# Patient Record
Sex: Female | Born: 1975 | Race: White | Hispanic: No | Smoking: Current every day smoker
Health system: Southern US, Community
[De-identification: ages and names within clinical notes are randomized; demographics above are authoritative.]

## PROBLEM LIST (undated history)

## (undated) DIAGNOSIS — N6310 Unspecified lump in the right breast, unspecified quadrant: Secondary | ICD-10-CM

## (undated) DIAGNOSIS — Z9889 Other specified postprocedural states: Secondary | ICD-10-CM

## (undated) DIAGNOSIS — R112 Nausea with vomiting, unspecified: Secondary | ICD-10-CM

## (undated) DIAGNOSIS — N879 Dysplasia of cervix uteri, unspecified: Secondary | ICD-10-CM

## (undated) DIAGNOSIS — Z87442 Personal history of urinary calculi: Secondary | ICD-10-CM

## (undated) DIAGNOSIS — T8859XA Other complications of anesthesia, initial encounter: Secondary | ICD-10-CM

## (undated) DIAGNOSIS — T4145XA Adverse effect of unspecified anesthetic, initial encounter: Secondary | ICD-10-CM

## (undated) HISTORY — PX: KNEE SURGERY: SHX244

## (undated) HISTORY — DX: Dysplasia of cervix uteri, unspecified: N87.9

## (undated) HISTORY — PX: BREAST BIOPSY: SHX20

## (undated) HISTORY — PX: OTHER SURGICAL HISTORY: SHX169

## (undated) HISTORY — PX: KIDNEY STONE SURGERY: SHX686

## (undated) HISTORY — PX: TUBAL LIGATION: SHX77

---

## 1998-02-27 HISTORY — PX: GYNECOLOGIC CRYOSURGERY: SHX857

## 1998-04-13 ENCOUNTER — Other Ambulatory Visit: Admission: RE | Admit: 1998-04-13 | Discharge: 1998-04-13 | Payer: Self-pay | Admitting: *Deleted

## 1998-12-10 ENCOUNTER — Encounter: Payer: Self-pay | Admitting: Obstetrics and Gynecology

## 1998-12-10 ENCOUNTER — Inpatient Hospital Stay (HOSPITAL_COMMUNITY): Admission: AD | Admit: 1998-12-10 | Discharge: 1998-12-10 | Payer: Self-pay | Admitting: Obstetrics and Gynecology

## 1998-12-13 ENCOUNTER — Encounter: Payer: Self-pay | Admitting: Obstetrics and Gynecology

## 1998-12-13 ENCOUNTER — Ambulatory Visit (HOSPITAL_COMMUNITY): Admission: RE | Admit: 1998-12-13 | Discharge: 1998-12-13 | Payer: Self-pay | Admitting: Obstetrics and Gynecology

## 1999-03-15 ENCOUNTER — Inpatient Hospital Stay (HOSPITAL_COMMUNITY): Admission: AD | Admit: 1999-03-15 | Discharge: 1999-03-15 | Payer: Self-pay | Admitting: Obstetrics and Gynecology

## 1999-04-10 ENCOUNTER — Inpatient Hospital Stay (HOSPITAL_COMMUNITY): Admission: AD | Admit: 1999-04-10 | Discharge: 1999-04-12 | Payer: Self-pay | Admitting: Obstetrics and Gynecology

## 1999-12-15 ENCOUNTER — Encounter: Payer: Self-pay | Admitting: Emergency Medicine

## 1999-12-15 ENCOUNTER — Emergency Department (HOSPITAL_COMMUNITY): Admission: EM | Admit: 1999-12-15 | Discharge: 1999-12-15 | Payer: Self-pay | Admitting: Emergency Medicine

## 2000-02-16 ENCOUNTER — Encounter: Payer: Self-pay | Admitting: Family Medicine

## 2000-02-16 ENCOUNTER — Ambulatory Visit (HOSPITAL_COMMUNITY): Admission: RE | Admit: 2000-02-16 | Discharge: 2000-02-16 | Payer: Self-pay | Admitting: Family Medicine

## 2000-03-08 ENCOUNTER — Ambulatory Visit (HOSPITAL_COMMUNITY): Admission: RE | Admit: 2000-03-08 | Discharge: 2000-03-08 | Payer: Self-pay | Admitting: Family Medicine

## 2000-03-08 ENCOUNTER — Encounter: Payer: Self-pay | Admitting: Family Medicine

## 2000-04-03 ENCOUNTER — Other Ambulatory Visit: Admission: RE | Admit: 2000-04-03 | Discharge: 2000-04-03 | Payer: Self-pay | Admitting: Family Medicine

## 2000-05-24 ENCOUNTER — Ambulatory Visit (HOSPITAL_COMMUNITY): Admission: RE | Admit: 2000-05-24 | Discharge: 2000-05-24 | Payer: Self-pay | Admitting: Family Medicine

## 2000-05-24 ENCOUNTER — Encounter: Payer: Self-pay | Admitting: Family Medicine

## 2000-08-13 ENCOUNTER — Encounter: Payer: Self-pay | Admitting: Family Medicine

## 2000-08-13 ENCOUNTER — Ambulatory Visit (HOSPITAL_COMMUNITY): Admission: RE | Admit: 2000-08-13 | Discharge: 2000-08-13 | Payer: Self-pay | Admitting: Family Medicine

## 2000-09-07 ENCOUNTER — Encounter: Payer: Self-pay | Admitting: Family Medicine

## 2000-09-07 ENCOUNTER — Ambulatory Visit (HOSPITAL_COMMUNITY): Admission: RE | Admit: 2000-09-07 | Discharge: 2000-09-07 | Payer: Self-pay | Admitting: Family Medicine

## 2000-09-23 ENCOUNTER — Observation Stay (HOSPITAL_COMMUNITY): Admission: AD | Admit: 2000-09-23 | Discharge: 2000-09-24 | Payer: Self-pay | Admitting: Family Medicine

## 2000-09-24 ENCOUNTER — Encounter: Payer: Self-pay | Admitting: Family Medicine

## 2000-09-30 ENCOUNTER — Inpatient Hospital Stay (HOSPITAL_COMMUNITY): Admission: AD | Admit: 2000-09-30 | Discharge: 2000-10-02 | Payer: Self-pay | Admitting: Family Medicine

## 2002-02-18 ENCOUNTER — Other Ambulatory Visit: Admission: RE | Admit: 2002-02-18 | Discharge: 2002-02-18 | Payer: Self-pay | Admitting: Family Medicine

## 2002-02-26 ENCOUNTER — Ambulatory Visit (HOSPITAL_COMMUNITY): Admission: RE | Admit: 2002-02-26 | Discharge: 2002-02-26 | Payer: Self-pay | Admitting: Family Medicine

## 2002-02-26 ENCOUNTER — Encounter: Payer: Self-pay | Admitting: Family Medicine

## 2002-12-07 ENCOUNTER — Emergency Department (HOSPITAL_COMMUNITY): Admission: EM | Admit: 2002-12-07 | Discharge: 2002-12-08 | Payer: Self-pay | Admitting: Emergency Medicine

## 2004-08-18 ENCOUNTER — Other Ambulatory Visit: Admission: RE | Admit: 2004-08-18 | Discharge: 2004-08-18 | Payer: Self-pay | Admitting: Family Medicine

## 2005-11-16 ENCOUNTER — Other Ambulatory Visit: Admission: RE | Admit: 2005-11-16 | Discharge: 2005-11-16 | Payer: Self-pay | Admitting: Family Medicine

## 2007-02-28 HISTORY — PX: CERVICAL BIOPSY  W/ LOOP ELECTRODE EXCISION: SUR135

## 2012-05-31 ENCOUNTER — Encounter: Payer: Self-pay | Admitting: Gynecology

## 2012-05-31 ENCOUNTER — Ambulatory Visit (INDEPENDENT_AMBULATORY_CARE_PROVIDER_SITE_OTHER): Payer: BC Managed Care – PPO | Admitting: Gynecology

## 2012-05-31 VITALS — BP 110/70 | Ht 63.0 in | Wt 150.0 lb

## 2012-05-31 DIAGNOSIS — N946 Dysmenorrhea, unspecified: Secondary | ICD-10-CM

## 2012-05-31 DIAGNOSIS — N926 Irregular menstruation, unspecified: Secondary | ICD-10-CM

## 2012-05-31 DIAGNOSIS — N9489 Other specified conditions associated with female genital organs and menstrual cycle: Secondary | ICD-10-CM

## 2012-05-31 LAB — CBC WITH DIFFERENTIAL/PLATELET
Eosinophils Relative: 3 % (ref 0–5)
Hemoglobin: 15.4 g/dL — ABNORMAL HIGH (ref 12.0–15.0)
Lymphocytes Relative: 34 % (ref 12–46)
Lymphs Abs: 3.5 10*3/uL (ref 0.7–4.0)
MCV: 88.7 fL (ref 78.0–100.0)
Monocytes Relative: 9 % (ref 3–12)
Neutrophils Relative %: 53 % (ref 43–77)
Platelets: 327 10*3/uL (ref 150–400)
RBC: 4.97 MIL/uL (ref 3.87–5.11)
WBC: 10.2 10*3/uL (ref 4.0–10.5)

## 2012-05-31 NOTE — Patient Instructions (Signed)
Follow up for lab results and ultrasound as scheduled. 

## 2012-05-31 NOTE — Progress Notes (Signed)
Caroline Bridges 09/09/1975 [id number]        37 y.o.  G3P3003 new patient complaining of new onset frequent painful periods. Patient notes regular menses through January and then began every 2 week bleeding with passage of clots and cramping. No history of this previously. No weight changes skin or hair changes. Never had an abnormal GYN exam in the past as far as leiomyoma or ovarian cysts. She does have a history of cervical dysplasia status post cryo and subsequent LEEP. Reports her Pap smears have all been normal over the last several years. She gets her GYN care through her primary physician with last reported physical in July to include breast exam and Pap smear. She was told that she was hyperthyroid as a teenager but was not treated and is currently on no medication.   Past medical history,surgical history, medications, allergies, family history and social history were all reviewed and documented in the EPIC chart. ROS:  Was performed and pertinent positives and negatives are included in the history.  Exam: Kim assistant Filed Vitals:   05/31/12 1503  BP: 110/70  Height: 5\' 3"  (1.6 m)  Weight: 150 lb (68.04 kg)   General appearance  Normal Skin grossly normal Head/Neck normal with no cervical or supraclavicular adenopathy thyroid normal Abdominal  soft, nontender, without masses, organomegaly or hernia Pelvic  Ext/BUS/vagina  normal   Cervix  normal with mild scarring  Uterus  axial to anteverted, normal size, shape and contour, midline and mobile nontender   Adnexa  Without masses or tenderness    Anus and perineum  normal   Rectovaginal  normal sphincter tone without palpated masses or tenderness.    Assessment/Plan:  37 y.o. G3P3003 female   1. Irregular painful periods occurring every 2 weeks, new onset over the past 2 months. No associated symptoms. We'll start with CBC FSH TSH prolactin hCG and sonohysterogram rule out intracavitary abnormalities such as myomas or polyps.  Patient will followup for the ultrasound and lab work. I reviewed various scenarios and possibilities to include hormonal manipulation, removal of polyps or submucous myomas, endometrial ablation, hysterectomy. 2. Stop smoking discussed. 3. Health maintenance. We'll plan full annual exam in July and address her acute issue now.    Dara Lords MD, 3:33 PM 05/31/2012

## 2012-06-01 LAB — TSH: TSH: 0.942 u[IU]/mL (ref 0.350–4.500)

## 2012-06-01 LAB — FOLLICLE STIMULATING HORMONE: FSH: 10.6 m[IU]/mL

## 2012-06-20 ENCOUNTER — Other Ambulatory Visit: Payer: Self-pay | Admitting: Gynecology

## 2012-06-20 DIAGNOSIS — N946 Dysmenorrhea, unspecified: Secondary | ICD-10-CM

## 2012-06-20 DIAGNOSIS — N926 Irregular menstruation, unspecified: Secondary | ICD-10-CM

## 2012-06-26 ENCOUNTER — Ambulatory Visit (INDEPENDENT_AMBULATORY_CARE_PROVIDER_SITE_OTHER): Payer: BC Managed Care – PPO

## 2012-06-26 ENCOUNTER — Ambulatory Visit (INDEPENDENT_AMBULATORY_CARE_PROVIDER_SITE_OTHER): Payer: BC Managed Care – PPO | Admitting: Gynecology

## 2012-06-26 ENCOUNTER — Encounter: Payer: Self-pay | Admitting: Gynecology

## 2012-06-26 DIAGNOSIS — N926 Irregular menstruation, unspecified: Secondary | ICD-10-CM

## 2012-06-26 DIAGNOSIS — N946 Dysmenorrhea, unspecified: Secondary | ICD-10-CM

## 2012-06-26 DIAGNOSIS — N8300 Follicular cyst of ovary, unspecified side: Secondary | ICD-10-CM

## 2012-06-26 DIAGNOSIS — N920 Excessive and frequent menstruation with regular cycle: Secondary | ICD-10-CM

## 2012-06-26 NOTE — Progress Notes (Signed)
Patient presents for sonohysterogram due to several month history of heavy irregular bleeding.  Her lab work returned showing hemoglobin 15, FSH 10, prolactin 12 and TSH 0.942.  Ultrasound shows normal uterine size and echotexture. Endometrial echo 8.3 mm. Right and left ovaries are visualized and normal. Cul-de-sac negative. Sonohysterogram performed, sterile technique, easy catheter introduction, good distention with no abnormality seen. Endometrial sample taken. Patient tolerated well.  Assessment and plan: Several month history your regular heavy bleeding. Ultrasound normal. Blood work normal. Options for management include observation, hormonal manipulation, endometrial ablation, Mirena IUD, hysterectomy. If she is status post BTL and she does smoke cigarettes. At this point due to the relative short nature of the irregular bleeding over several months we'll plan observation. She is due for her annual exam in August and she'll followup for this. If her bleeding gets substantially irregular before then she'll reproduce end. If she wants to discuss options again shortly present.

## 2012-06-26 NOTE — Patient Instructions (Signed)
Followup for annual exam in August. Sooner if bleeding is an issue.

## 2012-10-03 ENCOUNTER — Other Ambulatory Visit (HOSPITAL_COMMUNITY)
Admission: RE | Admit: 2012-10-03 | Discharge: 2012-10-03 | Disposition: A | Discharge status: Home / Self Care | Payer: BC Managed Care – PPO | Source: Ambulatory Visit | Attending: Gynecology | Admitting: Gynecology

## 2012-10-03 ENCOUNTER — Encounter: Payer: Self-pay | Admitting: Gynecology

## 2012-10-03 ENCOUNTER — Ambulatory Visit (INDEPENDENT_AMBULATORY_CARE_PROVIDER_SITE_OTHER): Payer: BC Managed Care – PPO | Admitting: Gynecology

## 2012-10-03 VITALS — BP 112/64 | Ht 64.0 in | Wt 149.0 lb

## 2012-10-03 DIAGNOSIS — Z1151 Encounter for screening for human papillomavirus (HPV): Secondary | ICD-10-CM | POA: Insufficient documentation

## 2012-10-03 DIAGNOSIS — Z01419 Encounter for gynecological examination (general) (routine) without abnormal findings: Secondary | ICD-10-CM

## 2012-10-03 NOTE — Addendum Note (Signed)
Addended by: Dayna Barker on: 10/03/2012 04:35 PM   Modules accepted: Orders

## 2012-10-03 NOTE — Progress Notes (Signed)
Caroline Bridges 1975-08-17 [id number]        37 y.o.  G3P3003 for annual exam.  Doing well without complaints.  Past medical history,surgical history, medications, allergies, family history and social history were all reviewed and documented in the EPIC chart.  ROS:  Performed and pertinent positives and negatives are included in the history, assessment and plan .  Exam: Kim assistant Filed Vitals:   10/03/12 1556  BP: 112/64  Height: 5\' 4"  (1.626 m)  Weight: 149 lb (67.586 kg)   General appearance  Normal Skin grossly normal Head/Neck normal with no cervical or supraclavicular adenopathy thyroid normal Lungs  clear Cardiac RR, without RMG Abdominal  soft, nontender, without masses, organomegaly or hernia Breasts  examined lying and sitting without masses, retractions, discharge or axillary adenopathy. Pelvic  Ext/BUS/vagina  normal   Cervix  somewhat scarred Pap/HPV  Uterus  anteverted, normal size, shape and contour, midline and mobile nontender   Adnexa  Without masses or tenderness    Anus and perineum  normal   Rectovaginal  normal sphincter tone without palpated masses or tenderness.    Assessment/Plan:  37 y.o. G39P3003 female for annual exam, regular menses, tubal sterilization.   1. History of regular menses/menorrhagia. Workup earlier this year to include a negative sonohysterogram and negative hormone levels. Patient's menses have regulated and she is having regular menses without intermenstrual bleeding. We'll continue to monitor. 2. Pap/HPV today. History of prior surgery 2000 and LEEP 2009. Reports Pap smears normal since then. These were done elsewhere I do not have copies of them. Assuming Pap/HPV today normal then plan less frequent screening interval. 3. Mammography. Screening recommendations for mammography between 35 and 40 discussed. Does have a maternal aunt in her early 54s but no other family member. Patient is going to wait closer to 40. SBE monthly  reviewed. 4. Health maintenance. Patient reports lab work done at her other physician's offices. No lab work done today. Followup in one year, sooner as needed  Note: This document was prepared with digital dictation and possible smart phrase technology. Any transcriptional errors that result from this process are unintentional.   Dara Lords MD, 4:26 PM 10/03/2012

## 2012-10-03 NOTE — Patient Instructions (Signed)
Follow up in one year, sooner as needed. 

## 2013-02-11 ENCOUNTER — Encounter: Payer: Self-pay | Admitting: Family Medicine

## 2013-02-11 ENCOUNTER — Ambulatory Visit (INDEPENDENT_AMBULATORY_CARE_PROVIDER_SITE_OTHER): Payer: BC Managed Care – PPO | Admitting: Family Medicine

## 2013-02-11 VITALS — BP 110/68 | HR 68 | Temp 99.2°F | Ht 64.0 in | Wt 149.0 lb

## 2013-02-11 DIAGNOSIS — R55 Syncope and collapse: Secondary | ICD-10-CM

## 2013-02-11 LAB — POCT GLYCOSYLATED HEMOGLOBIN (HGB A1C): Hemoglobin A1C: 5

## 2013-02-11 LAB — POCT CBC
Granulocyte percent: 59.9 %G (ref 37–80)
HCT, POC: 48.7 % — AB (ref 37.7–47.9)
Hemoglobin: 15.7 g/dL (ref 12.2–16.2)
POC Granulocyte: 5.6 (ref 2–6.9)
RDW, POC: 12.7 %

## 2013-02-11 NOTE — Progress Notes (Signed)
   Subjective:    Patient ID: Caroline Bridges, female    DOB: 06/22/1975, 37 y.o.   MRN: [id number]  HPI Patient presents today for followup of syncopal episode. Patient was seen over the weekend after a syncopal episode while standing cooking dinner. Patient was seen and more had urgent care. Per report, patient was noted to have a blood sugar in the 90s as well as a blood pressure in the 90s at the time EMS. Patient had a workup done including EKG and general blood work. Had a blood sugar of around 90 at the urgent care. EKG was within normal limits per patient. Blood pressure was normalized in the 110s. Patient denies any chest pain, shortness of breath, nausea, diaphoresis prior to episode. Patient says she fell and hit her head and got up immediately. Did not get a head CT at that time. Has had some mild occipital headache since the fall. Otherwise, no hemiparesis confusion, or vision changes. No tongue biting, bowel or bladder incontinence associated with the episode.  Review of Systems  All other systems reviewed and are negative.       Objective:   Physical Exam  Constitutional: She appears well-developed and well-nourished.  HENT:  Head: Normocephalic and atraumatic.    Mild tenderness without bruising over affected area.   Eyes: Conjunctivae are normal. Pupils are equal, round, and reactive to light.  Neck: Normal range of motion. Neck supple.  Cardiovascular: Normal rate and regular rhythm.   Pulmonary/Chest: Effort normal and breath sounds normal.  Abdominal: Soft.  Musculoskeletal: Normal range of motion.  Neurological: She is alert.  Skin: Skin is warm.          Assessment & Plan:  Syncopal episodes - Plan: POCT CBC, Comprehensive metabolic panel, POCT glycosylated hemoglobin (Hb A1C), Comprehensive metabolic panel  Workup for solitary syncopal episode seems to be indicative of a transient hypoglycemic/hypotensive episode. Will recheck labs including  thyroid, A1c, metabolic panel, CBC. Discussed adequate hydration as well as regular meals. Also discussed smoking cessation. Had a relatively lengthy discussion with patient. Discuss with patient that we could certainly consider referral to cardiology if she desires for further workup of this solitary episode. We'll otherwise continue with watchful waiting in the interim.

## 2013-02-12 LAB — CMP14+EGFR
ALT: 12 IU/L (ref 0–32)
Albumin/Globulin Ratio: 1.8 (ref 1.1–2.5)
CO2: 20 mmol/L (ref 18–29)
Calcium: 9.1 mg/dL (ref 8.7–10.2)
GFR calc non Af Amer: 100 mL/min/{1.73_m2} (ref >59)
Glucose: 83 mg/dL (ref 65–99)
Potassium: 4.2 mmol/L (ref 3.5–5.2)
Total Protein: 6.6 g/dL (ref 6.0–8.5)

## 2013-02-17 ENCOUNTER — Telehealth: Payer: Self-pay | Admitting: Family Medicine

## 2013-02-17 DIAGNOSIS — R55 Syncope and collapse: Secondary | ICD-10-CM

## 2013-02-18 NOTE — Telephone Encounter (Signed)
Discussed labs with patient.  TSH wasn't included. I will order this and she will come in tomorrow to have it drawn.

## 2013-02-19 ENCOUNTER — Other Ambulatory Visit (INDEPENDENT_AMBULATORY_CARE_PROVIDER_SITE_OTHER): Payer: BC Managed Care – PPO

## 2013-02-19 DIAGNOSIS — R5381 Other malaise: Secondary | ICD-10-CM

## 2013-02-20 LAB — THYROID PANEL WITH TSH
Free Thyroxine Index: 2.1 (ref 1.2–4.9)
T3 Uptake Ratio: 27 % (ref 24–39)
T4, Total: 7.9 ug/dL (ref 4.5–12.0)
TSH: 0.707 u[IU]/mL (ref 0.450–4.500)

## 2013-10-06 ENCOUNTER — Encounter: Payer: BC Managed Care – PPO | Admitting: Gynecology

## 2013-10-17 ENCOUNTER — Encounter: Payer: Self-pay | Admitting: Gynecology

## 2013-10-17 ENCOUNTER — Encounter: Payer: BC Managed Care – PPO | Admitting: Gynecology

## 2013-10-17 ENCOUNTER — Ambulatory Visit (INDEPENDENT_AMBULATORY_CARE_PROVIDER_SITE_OTHER): Payer: BC Managed Care – PPO | Admitting: Gynecology

## 2013-10-17 VITALS — BP 104/60 | Ht 63.0 in | Wt 150.0 lb

## 2013-10-17 DIAGNOSIS — Z01419 Encounter for gynecological examination (general) (routine) without abnormal findings: Secondary | ICD-10-CM

## 2013-10-17 NOTE — Patient Instructions (Signed)
You may obtain a copy of any labs that were done today by logging onto MyChart as outlined in the instructions provided with your AVS (after visit summary). The office will not call with normal lab results but certainly if there are any significant abnormalities then we will contact you.   Health Maintenance, Female A healthy lifestyle and preventative care can promote health and wellness.  Maintain regular health, dental, and eye exams.  Eat a healthy diet. Foods like vegetables, fruits, whole grains, low-fat dairy products, and lean protein foods contain the nutrients you need without too many calories. Decrease your intake of foods high in solid fats, added sugars, and salt. Get information about a proper diet from your caregiver, if necessary.  Regular physical exercise is one of the most important things you can do for your health. Most adults should get at least 150 minutes of moderate-intensity exercise (any activity that increases your heart rate and causes you to sweat) each week. In addition, most adults need muscle-strengthening exercises on 2 or more days a week.   Maintain a healthy weight. The body mass index (BMI) is a screening tool to identify possible weight problems. It provides an estimate of body fat based on height and weight. Your caregiver can help determine your BMI, and can help you achieve or maintain a healthy weight. For adults 20 years and older:  A BMI below 18.5 is considered underweight.  A BMI of 18.5 to 24.9 is normal.  A BMI of 25 to 29.9 is considered overweight.  A BMI of 30 and above is considered obese.  Maintain normal blood lipids and cholesterol by exercising and minimizing your intake of saturated fat. Eat a balanced diet with plenty of fruits and vegetables. Blood tests for lipids and cholesterol should begin at age 61 and be repeated every 5 years. If your lipid or cholesterol levels are high, you are over 50, or you are a high risk for heart  disease, you may need your cholesterol levels checked more frequently.Ongoing high lipid and cholesterol levels should be treated with medicines if diet and exercise are not effective.  If you smoke, find out from your caregiver how to quit. If you do not use tobacco, do not start.  Lung cancer screening is recommended for adults aged 33 80 years who are at high risk for developing lung cancer because of a history of smoking. Yearly low-dose computed tomography (CT) is recommended for people who have at least a 30-pack-year history of smoking and are a current smoker or have quit within the past 15 years. A pack year of smoking is smoking an average of 1 pack of cigarettes a day for 1 year (for example: 1 pack a day for 30 years or 2 packs a day for 15 years). Yearly screening should continue until the smoker has stopped smoking for at least 15 years. Yearly screening should also be stopped for people who develop a health problem that would prevent them from having lung cancer treatment.  If you are pregnant, do not drink alcohol. If you are breastfeeding, be very cautious about drinking alcohol. If you are not pregnant and choose to drink alcohol, do not exceed 1 drink per day. One drink is considered to be 12 ounces (355 mL) of beer, 5 ounces (148 mL) of wine, or 1.5 ounces (44 mL) of liquor.  Avoid use of street drugs. Do not share needles with anyone. Ask for help if you need support or instructions about stopping  the use of drugs.  High blood pressure causes heart disease and increases the risk of stroke. Blood pressure should be checked at least every 1 to 2 years. Ongoing high blood pressure should be treated with medicines, if weight loss and exercise are not effective.  If you are 59 to 37 years old, ask your caregiver if you should take aspirin to prevent strokes.  Diabetes screening involves taking a blood sample to check your fasting blood sugar level. This should be done once every 3  years, after age 91, if you are within normal weight and without risk factors for diabetes. Testing should be considered at a younger age or be carried out more frequently if you are overweight and have at least 1 risk factor for diabetes.  Breast cancer screening is essential preventative care for women. You should practice "breast self-awareness." This means understanding the normal appearance and feel of your breasts and may include breast self-examination. Any changes detected, no matter how small, should be reported to a caregiver. Women in their 66s and 30s should have a clinical breast exam (CBE) by a caregiver as part of a regular health exam every 1 to 3 years. After age 101, women should have a CBE every year. Starting at age 100, women should consider having a mammogram (breast X-ray) every year. Women who have a family history of breast cancer should talk to their caregiver about genetic screening. Women at a high risk of breast cancer should talk to their caregiver about having an MRI and a mammogram every year.  Breast cancer gene (BRCA)-related cancer risk assessment is recommended for women who have family members with BRCA-related cancers. BRCA-related cancers include breast, ovarian, tubal, and peritoneal cancers. Having family members with these cancers may be associated with an increased risk for harmful changes (mutations) in the breast cancer genes BRCA1 and BRCA2. Results of the assessment will determine the need for genetic counseling and BRCA1 and BRCA2 testing.  The Pap test is a screening test for cervical cancer. Women should have a Pap test starting at age 57. Between ages 25 and 35, Pap tests should be repeated every 2 years. Beginning at age 37, you should have a Pap test every 3 years as long as the past 3 Pap tests have been normal. If you had a hysterectomy for a problem that was not cancer or a condition that could lead to cancer, then you no longer need Pap tests. If you are  between ages 50 and 76, and you have had normal Pap tests going back 10 years, you no longer need Pap tests. If you have had past treatment for cervical cancer or a condition that could lead to cancer, you need Pap tests and screening for cancer for at least 20 years after your treatment. If Pap tests have been discontinued, risk factors (such as a new sexual partner) need to be reassessed to determine if screening should be resumed. Some women have medical problems that increase the chance of getting cervical cancer. In these cases, your caregiver may recommend more frequent screening and Pap tests.  The human papillomavirus (HPV) test is an additional test that may be used for cervical cancer screening. The HPV test looks for the virus that can cause the cell changes on the cervix. The cells collected during the Pap test can be tested for HPV. The HPV test could be used to screen women aged 44 years and older, and should be used in women of any age  who have unclear Pap test results. After the age of 55, women should have HPV testing at the same frequency as a Pap test.  Colorectal cancer can be detected and often prevented. Most routine colorectal cancer screening begins at the age of 44 and continues through age 20. However, your caregiver may recommend screening at an earlier age if you have risk factors for colon cancer. On a yearly basis, your caregiver may provide home test kits to check for hidden blood in the stool. Use of a small camera at the end of a tube, to directly examine the colon (sigmoidoscopy or colonoscopy), can detect the earliest forms of colorectal cancer. Talk to your caregiver about this at age 86, when routine screening begins. Direct examination of the colon should be repeated every 5 to 10 years through age 13, unless early forms of pre-cancerous polyps or small growths are found.  Hepatitis C blood testing is recommended for all people born from [birth year] through 1965 and any  individual with known risks for hepatitis C.  Practice safe sex. Use condoms and avoid high-risk sexual practices to reduce the spread of sexually transmitted infections (STIs). Sexually active women aged 36 and younger should be checked for Chlamydia, which is a common sexually transmitted infection. Older women with new or multiple partners should also be tested for Chlamydia. Testing for other STIs is recommended if you are sexually active and at increased risk.  Osteoporosis is a disease in which the bones lose minerals and strength with aging. This can result in serious bone fractures. The risk of osteoporosis can be identified using a bone density scan. Women ages 20 and over and women at risk for fractures or osteoporosis should discuss screening with their caregivers. Ask your caregiver whether you should be taking a calcium supplement or vitamin D to reduce the rate of osteoporosis.  Menopause can be associated with physical symptoms and risks. Hormone replacement therapy is available to decrease symptoms and risks. You should talk to your caregiver about whether hormone replacement therapy is right for you.  Use sunscreen. Apply sunscreen liberally and repeatedly throughout the day. You should seek shade when your shadow is shorter than you. Protect yourself by wearing long sleeves, pants, a wide-brimmed hat, and sunglasses year round, whenever you are outdoors.  Notify your caregiver of new moles or changes in moles, especially if there is a change in shape or color. Also notify your caregiver if a mole is larger than the size of a pencil eraser.  Stay current with your immunizations. Document Released: 08/29/2010 Document Revised: 06/10/2012 Document Reviewed: 08/29/2010 [facility] Patient Information 2014 [employer].

## 2013-10-17 NOTE — Progress Notes (Signed)
Caroline Bridges [birth year] [id number]        38 y.o.  G3P3003 for annual exam.  Several issues noted below.  Past medical history,surgical history, problem list, medications, allergies, family history and social history were all reviewed and documented as reviewed in the EPIC chart.  ROS:  12 system ROS performed with pertinent positives and negatives included in the history, assessment and plan.   Additional significant findings :  None   Exam: [facility] personassistant Filed Vitals:   10/17/13 1543  BP: 104/60  Height: 5\' 3"  (1.6 m)  Weight: 150 lb (68.04 kg)   General appearance:  Normal affect, orientation and appearance. Skin: Grossly normal HEENT: Without gross lesions.  No cervical or supraclavicular adenopathy. Thyroid normal.  Lungs:  Clear without wheezing, rales or rhonchi Cardiac: RR, without RMG Abdominal:  Soft, nontender, without masses, guarding, rebound, organomegaly or hernia Breasts:  Examined lying and sitting without masses, retractions, discharge or axillary adenopathy. Pelvic:  Ext/BUS/vagina normal  Cervix normal  Uterus anteverted, normal size, shape and contour, midline and mobile nontender   Adnexa  Without masses or tenderness    Anus and perineum  Normal   Rectovaginal  Normal sphincter tone without palpated masses or tenderness.    Assessment/Plan:  38 y.o. 843P3003 female for annual exam with regular menses, tubal sterilization.   1. History of irregular menses and menorrhagia. Patient notes that her cycles are actually more regular and monthly. Also lighter. She was evaluated with a sonohysterogram and negative hormone studies. Patient's comfortable monitoring and will call if she has any significant irregularities. 2. Pap/HPV negative 2014. No Pap smear done today. History of cryo-surgery 2003 2009. Pap smears reportedly normal since then. Plan less frequent screening interval and repeat Pap smear in 2 years. 3. Screening mammographic recommendations between 35  and 40 reviewed. Patient has no strong family history prefers to wait closer to 40. SBE monthly review. 4. Health maintenance. Patient reports blood work done through work. No blood work done today. Followup in one year, sooner as needed.   Note: This document was prepared with digital dictation and possible smart phrase technology. Any transcriptional errors that result from this process are unintentional.   Dara LordsFONTAINE,TIMOTHY P MD, 4:31 PM 10/17/2013

## 2013-12-29 ENCOUNTER — Encounter: Payer: Self-pay | Admitting: Gynecology

## 2014-10-19 ENCOUNTER — Encounter: Payer: Self-pay | Admitting: Gynecology

## 2014-10-26 ENCOUNTER — Encounter: Payer: Self-pay | Admitting: Gynecology

## 2014-10-26 ENCOUNTER — Ambulatory Visit (INDEPENDENT_AMBULATORY_CARE_PROVIDER_SITE_OTHER): Payer: BLUE CROSS/BLUE SHIELD | Admitting: Gynecology

## 2014-10-26 VITALS — BP 116/76 | Ht 63.0 in | Wt 159.0 lb

## 2014-10-26 DIAGNOSIS — Z01419 Encounter for gynecological examination (general) (routine) without abnormal findings: Secondary | ICD-10-CM

## 2014-10-26 NOTE — Progress Notes (Signed)
Caroline Bridges [birth year] [id number]        39 y.o.  G3P3003 for annual exam.  Doing well without complaints.  Past medical history,surgical history, problem list, medications, allergies, family history and social history were all reviewed and documented as reviewed in the EPIC chart.  ROS:  Performed with pertinent positives and negatives included in the history, assessment and plan.   Additional significant findings :  none   Exam: [facility] person Vitals:   10/26/14 1542  BP: 116/76  Height:  (1.6 m)  Weight: 159 lb (72.122 kg)   General appearance:  Normal affect, orientation and appearance. Skin: Grossly normal HEENT: Without gross lesions.  No cervical or supraclavicular adenopathy. Thyroid normal.  Lungs:  Clear without wheezing, rales or rhonchi Cardiac: RR, without RMG Abdominal:  Soft, nontender, without masses, guarding, rebound, organomegaly or hernia Breasts:  Examined lying and sitting without masses, retractions, discharge or axillary adenopathy. Pelvic:  Ext/BUS/vagina normal  Cervix normal  Uterus anteverted, normal size, shape and contour, midline and mobile nontender   Adnexa  Without masses or tenderness    Anus and perineum  Normal   Rectovaginal  Normal sphincter tone without palpated masses or tenderness.    Assessment/Plan:  39 y.o. G50P3003 female for annual exam with regular menses, tubal sterilization.   1. Pap smear/HPV negative 2014. No Pap smear done today. History of cryosurgery 2000 and LEEP 2009 with negative Pap smears since. Plan follow up Pap smear next year. 2. Mammography planned at age 72. SBE monthly reviewed. Screening mammographic recommendations between 35 and 40 discussed. 3. Health maintenance. No routine blood work done as this is done at her primary physician's office. Follow up 1 year, sooner as needed.   Dara Lords MD, 4:00 PM 10/26/2014

## 2014-10-26 NOTE — Patient Instructions (Signed)
Consider Stop Smoking.  Help is available at [facility]'s smoking cessation program @ [email or web address] or 336-832-0838. OR 1-800-QUIT-NOW (1-800-784-8669) for free smoking cessation counseling.  Smokefree.gov (http://www.smokefree.gov) provides free, accurate, evidence-based information and professional assistance to help support the immediate and long-term needs of people trying to quit smoking.    Smoking Hazards Smoking cigarettes is extremely bad for your health. Tobacco smoke has over 200 known poisons in it. There are over 60 chemicals in tobacco smoke that cause cancer. Some of the chemicals found in cigarette smoke include:  Cyanide.  Benzene.  Formaldehyde.  Methanol (wood alcohol).  Acetylene (fuel used in welding torches).  Ammonia.  Cigarette smoke also contains the poisonous gases nitrogen oxide and carbon monoxide.  Cigarette smokers have an increased risk of many serious medical problems, including: Lung cancer.  Lung disease (such as pneumonia, bronchitis, and emphysema).  Heart attack and chest pain due to the heart not getting enough oxygen (angina).  Heart disease and peripheral blood vessel disease.  Hypertension.  Stroke.  Oral cancer (cancer of the lip, mouth, or voice box).  Bladder cancer.  Pancreatic cancer.  Cervical cancer.  Pregnancy complications, including premature birth.  Low birthweight babies.  Early menopause.  Lower estrogen level for women.  Infertility.  Facial wrinkles.  Blindness.  Increased risk of broken bones (fractures).  Senile dementia.  Stillbirths and smaller newborn babies, birth defects, and genetic damage to sperm.  Stomach ulcers and internal bleeding.  Children of smokers have an increased risk of the following, because of secondhand smoke exposure:  Sudden infant death syndrome (SIDS).  Respiratory infections.  Lung cancer.  Heart disease.  Ear infections.  Smoking causes approximately: 90% of all lung cancer  deaths in men.  80% of all lung cancer deaths in women.  90% of deaths from chronic obstructive lung disease.  Compared with nonsmokers, smoking increases the risk of: Coronary heart disease by 2 to 4 times.  Stroke by 2 to 4 times.  Men developing lung cancer by 23 times.  Women developing lung cancer by 13 times.  Dying from chronic obstructive lung diseases by 12 times.  Someone who smokes 2 packs a day loses about 8 years of his or her life. Even smoking lightly shortens your life expectancy by several years. You can greatly reduce the risk of medical problems for you and your family by stopping now. Smoking is the most preventable cause of death and disease in our society. Within days of quitting smoking, your circulation returns to normal, you decrease the risk of having a heart attack, and your lung capacity improves. There may be some increased phlegm in the first few days after quitting, and it may take months for your lungs to clear up completely. Quitting for 10 years cuts your lung cancer risk to almost that of a nonsmoker. WHY IS SMOKING ADDICTIVE? Nicotine is the chemical agent in tobacco that is capable of causing addiction or dependence.  When you smoke and inhale, nicotine is absorbed rapidly into the bloodstream through your lungs. Nicotine absorbed through the lungs is capable of creating a powerful addiction. Both inhaled and non-inhaled nicotine may be addictive.  Addiction studies of cigarettes and spit tobacco show that addiction to nicotine occurs mainly during the teen years, when young people begin using tobacco products.  WHAT ARE THE BENEFITS OF QUITTING?  There are many health benefits to quitting smoking.  Likelihood of developing cancer and heart disease decreases. Health improvements are seen almost immediately.    Blood pressure, pulse rate, and breathing patterns start returning to normal soon after quitting.  People who quit may see an improvement in their overall  quality of life.  Some people choose to quit all at once. Other options include nicotine replacement products, such as patches, gum, and nasal sprays. Do not use these products without first checking with your caregiver. QUITTING SMOKING It is not easy to quit smoking. Nicotine is addicting, and longtime habits are hard to change. To start, you can write down all your reasons for quitting, tell your family and friends you want to quit, and ask for their help. Throw your cigarettes away, chew gum or cinnamon sticks, keep your hands busy, and drink extra water or juice. Go for walks and practice deep breathing to relax. Think of all the money you are saving: around $1,000 a year, for the average pack-a-day smoker. Nicotine patches and gum have been shown to improve success at efforts to stop smoking. Zyban (bupropion) is an anti-depressant drug that can be prescribed to reduce nicotine withdrawal symptoms and to suppress the urge to smoke. Smoking is an addiction with both physical and psychological effects. Joining a stop-smoking support group can help you cope with the emotional issues. For more information and advice on programs to stop smoking, call your doctor, your local hospital, or these organizations: American [employer] Association - 1-800-LUNGUSA   Smoking Cessation  This document explains the best ways for you to quit smoking and new treatments to help. It lists new medicines that can double or triple your chances of quitting and quitting for good. It also considers ways to avoid relapses and concerns you may have about quitting, including weight gain. NICOTINE: A POWERFUL ADDICTION If you have tried to quit smoking, you know how hard it can be. It is hard because nicotine is a very addictive drug. For some people, it can be as addictive as heroin or cocaine. Usually, people make 2 or 3 tries, or more, before finally being able to quit. Each time you try to quit, you can learn about what helps and  what hurts. Quitting takes hard work and a lot of effort, but you can quit smoking. QUITTING SMOKING IS ONE OF THE MOST IMPORTANT THINGS YOU WILL EVER DO.  You will live longer, feel better, and live better.   The impact on your body of quitting smoking is felt almost immediately:   Within 20 minutes, blood pressure decreases. Pulse returns to its normal level.   After 8 hours, carbon monoxide levels in the blood return to normal. Oxygen level increases.   After 24 hours, chance of heart attack starts to decrease. Breath, hair, and body stop smelling like smoke.   After 48 hours, damaged nerve endings begin to recover. Sense of taste and smell improve.   After 72 hours, the body is virtually free of nicotine. Bronchial tubes relax and breathing becomes easier.   After 2 to 12 weeks, lungs can hold more air. Exercise becomes easier and circulation improves.   Quitting will reduce your risk of having a heart attack, stroke, cancer, or lung disease:   After 1 year, the risk of coronary heart disease is cut in half.   After 5 years, the risk of stroke falls to the same as a nonsmoker.   After 10 years, the risk of lung cancer is cut in half and the risk of other cancers decreases significantly.   After 15 years, the risk of coronary heart disease drops, usually   to the level of a nonsmoker.   If you are pregnant, quitting smoking will improve your chances of having a healthy baby.   The people you live with, especially your children, will be healthier.   You will have extra money to spend on things other than cigarettes.  FIVE KEYS TO QUITTING Studies have shown that these 5 steps will help you quit smoking and quit for good. You have the best chances of quitting if you use them together:  Get ready.   Get support and encouragement.   Learn new skills and behaviors.   Get medicine to reduce your nicotine addiction and use it correctly.   Be prepared for relapse or difficult  situations. Be determined to continue trying to quit, even if you do not succeed at first.  1. GET READY  Set a quit date.   Change your environment.   Get rid of ALL cigarettes, ashtrays, matches, and lighters in your home, car, and place of work.   Do not let people smoke in your home.   Review your past attempts to quit. Think about what worked and what did not.   Once you quit, do not smoke. NOT EVEN A PUFF!  2. GET SUPPORT AND ENCOURAGEMENT Studies have shown that you have a better chance of being successful if you have help. You can get support in many ways.  Tell your family, friends, and coworkers that you are going to quit and need their support. Ask them not to smoke around you.   Talk to your caregivers (doctor, dentist, nurse, pharmacist, psychologist, and/or smoking counselor).   Get individual, group, or telephone counseling and support. The more counseling you have, the better your chances are of quitting. Programs are available at local hospitals and health centers. Call your local health department for information about programs in your area.   Spiritual beliefs and practices may help some smokers quit.   Quit meters are small computer programs online or downloadable that keep track of quit statistics, such as amount of "quit-time," cigarettes not smoked, and money saved.   Many smokers find one or more of the many self-help books available useful in helping them quit and stay off tobacco.  3. LEARN NEW SKILLS AND BEHAVIORS  Try to distract yourself from urges to smoke. Talk to someone, go for a walk, or occupy your time with a task.   When you first try to quit, change your routine. Take a different route to work. Drink tea instead of coffee. Eat breakfast in a different place.   Do something to reduce your stress. Take a hot bath, exercise, or read a book.   Plan something enjoyable to do every day. Reward yourself for not smoking.   Explore interactive  web-based programs that specialize in helping you quit.  4. GET MEDICINE AND USE IT CORRECTLY Medicines can help you stop smoking and decrease the urge to smoke. Combining medicine with the above behavioral methods and support can quadruple your chances of successfully quitting smoking. The [place] Food and Drug Administration (FDA) has approved 7 medicines to help you quit smoking. These medicines fall into 3 categories.  Nicotine replacement therapy (delivers nicotine to your body without the negative effects and risks of smoking):   Nicotine gum: Available over-the-counter.   Nicotine lozenges: Available over-the-counter.   Nicotine inhaler: Available by prescription.   Nicotine nasal spray: Available by prescription.   Nicotine skin patches (transdermal): Available by prescription and over-the-counter.   Antidepressant medicine (  helps people abstain from smoking, but how this works is unknown):   Bupropion sustained-release (SR) tablets: Available by prescription.   Nicotinic receptor partial agonist (simulates the effect of nicotine in your brain):   Varenicline tartrate tablets: Available by prescription.   Ask your caregiver for advice about which medicines to use and how to use them. Carefully read the information on the package.   Everyone who is trying to quit may benefit from using a medicine. If you are pregnant or trying to become pregnant, nursing an infant, you are under age 18, or you smoke fewer than 10 cigarettes per day, talk to your caregiver before taking any nicotine replacement medicines.   You should stop using a nicotine replacement product and call your caregiver if you experience nausea, dizziness, weakness, vomiting, fast or irregular heartbeat, mouth problems with the lozenge or gum, or redness or swelling of the skin around the patch that does not go away.   Do not use any other product containing nicotine while using a nicotine replacement product.   Talk  to your caregiver before using these products if you have diabetes, heart disease, asthma, stomach ulcers, you had a recent heart attack, you have high blood pressure that is not controlled with medicine, a history of irregular heartbeat, or you have been prescribed medicine to help you quit smoking.  5. BE PREPARED FOR RELAPSE OR DIFFICULT SITUATIONS  Most relapses occur within the first 3 months after quitting. Do not be discouraged if you start smoking again. Remember, most people try several times before they finally quit.   You may have symptoms of withdrawal because your body is used to nicotine. You may crave cigarettes, be irritable, feel very hungry, cough often, get headaches, or have difficulty concentrating.   The withdrawal symptoms are only temporary. They are strongest when you first quit, but they will go away within 10 to 14 days.  Here are some difficult situations to watch for:  Alcohol. Avoid drinking alcohol. Drinking lowers your chances of successfully quitting.   Caffeine. Try to reduce the amount of caffeine you consume. It also lowers your chances of successfully quitting.   Other smokers. Being around smoking can make you want to smoke. Avoid smokers.   Weight gain. Many smokers will gain weight when they quit, usually less than 10 pounds. Eat a healthy diet and stay active. Do not let weight gain distract you from your main goal, quitting smoking. Some medicines that help you quit smoking may also help delay weight gain. You can always lose the weight gained after you quit.   Bad mood or depression. There are a lot of ways to improve your mood other than smoking.  If you are having problems with any of these situations, talk to your caregiver. SPECIAL SITUATIONS AND CONDITIONS Studies suggest that everyone can quit smoking. Your situation or condition can give you a special reason to quit.  Pregnant women/new mothers: By quitting, you protect your baby's health and  your own.   Hospitalized patients: By quitting, you reduce health problems and help healing.   Heart attack patients: By quitting, you reduce your risk of a second heart attack.   Lung, head, and neck cancer patients: By quitting, you reduce your chance of a second cancer.   Parents of children and adolescents: By quitting, you protect your children from illnesses caused by secondhand smoke.  QUESTIONS TO THINK ABOUT Think about the following questions before you try to stop smoking. You may   want to talk about your answers with your caregiver.  Why do you want to quit?   If you tried to quit in the past, what helped and what did not?   What will be the most difficult situations for you after you quit? How will you plan to handle them?   Who can help you through the tough times? Your family? Friends? Caregiver?   What pleasures do you get from smoking? What ways can you still get pleasure if you quit?  Here are some questions to ask your caregiver:  How can you help me to be successful at quitting?   What medicine do you think would be best for me and how should I take it?   What should I do if I need more help?   What is smoking withdrawal like? How can I get information on withdrawal?  Quitting takes hard work and a lot of effort, but you can quit smoking.  You may obtain a copy of any labs that were done today by logging onto MyChart as outlined in the instructions provided with your AVS (after visit summary). The office will not call with normal lab results but certainly if there are any significant abnormalities then we will contact you.   Health Maintenance Adopting a healthy lifestyle and getting preventive care can go a long way to promote health and wellness. Talk with your health care provider about what schedule of regular examinations is right for you. This is a good chance for you to check in with your provider about disease prevention and staying healthy. In between  checkups, there are plenty of things you can do on your own. Experts have done a lot of research about which lifestyle changes and preventive measures are most likely to keep you healthy. Ask your health care provider for more information. WEIGHT AND DIET  Eat a healthy diet  Be sure to include plenty of vegetables, fruits, low-fat dairy products, and lean protein.  Do not eat a lot of foods high in solid fats, added sugars, or salt.  Get regular exercise. This is one of the most important things you can do for your health.  Most adults should exercise for at least 150 minutes each week. The exercise should increase your heart rate and make you sweat (moderate-intensity exercise).  Most adults should also do strengthening exercises at least twice a week. This is in addition to the moderate-intensity exercise.  Maintain a healthy weight  Body mass index (BMI) is a measurement that can be used to identify possible weight problems. It estimates body fat based on height and weight. Your health care provider can help determine your BMI and help you achieve or maintain a healthy weight.  For females 20 years of age and older:   A BMI below 18.5 is considered underweight.  A BMI of 18.5 to 24.9 is normal.  A BMI of 25 to 29.9 is considered overweight.  A BMI of 30 and above is considered obese.  Watch levels of cholesterol and blood lipids  You should start having your blood tested for lipids and cholesterol at 38 years of age, then have this test every 5 years.  You may need to have your cholesterol levels checked more often if:  Your lipid or cholesterol levels are high.  You are older than 38 years of age.  You are at high risk for heart disease.  CANCER SCREENING   Lung Cancer  Lung cancer screening is recommended for   adults 55-80 years old who are at high risk for lung cancer because of a history of smoking.  A yearly low-dose CT scan of the lungs is recommended for  people who:  Currently smoke.  Have quit within the past 15 years.  Have at least a 30-pack-year history of smoking. A pack year is smoking an average of one pack of cigarettes a day for 1 year.  Yearly screening should continue until it has been 15 years since you quit.  Yearly screening should stop if you develop a health problem that would prevent you from having lung cancer treatment.  Breast Cancer  Practice breast self-awareness. This means understanding how your breasts normally appear and feel.  It also means doing regular breast self-exams. Let your health care provider know about any changes, no matter how small.  If you are in your 20s or 30s, you should have a clinical breast exam (CBE) by a health care provider every 1-3 years as part of a regular health exam.  If you are 40 or older, have a CBE every year. Also consider having a breast X-ray (mammogram) every year.  If you have a family history of breast cancer, talk to your health care provider about genetic screening.  If you are at high risk for breast cancer, talk to your health care provider about having an MRI and a mammogram every year.  Breast cancer gene (BRCA) assessment is recommended for women who have family members with BRCA-related cancers. BRCA-related cancers include:  Breast.  Ovarian.  Tubal.  Peritoneal cancers.  Results of the assessment will determine the need for genetic counseling and BRCA1 and BRCA2 testing. Cervical Cancer Routine pelvic examinations to screen for cervical cancer are no longer recommended for nonpregnant women who are considered low risk for cancer of the pelvic organs (ovaries, uterus, and vagina) and who do not have symptoms. A pelvic examination may be necessary if you have symptoms including those associated with pelvic infections. Ask your health care provider if a screening pelvic exam is right for you.   The Pap test is the screening test for cervical cancer for  women who are considered at risk.  If you had a hysterectomy for a problem that was not cancer or a condition that could lead to cancer, then you no longer need Pap tests.  If you are older than 65 years, and you have had normal Pap tests for the past 10 years, you no longer need to have Pap tests.  If you have had past treatment for cervical cancer or a condition that could lead to cancer, you need Pap tests and screening for cancer for at least 20 years after your treatment.  If you no longer get a Pap test, assess your risk factors if they change (such as having a new sexual partner). This can affect whether you should start being screened again.  Some women have medical problems that increase their chance of getting cervical cancer. If this is the case for you, your health care provider may recommend more frequent screening and Pap tests.  The human papillomavirus (HPV) test is another test that may be used for cervical cancer screening. The HPV test looks for the virus that can cause cell changes in the cervix. The cells collected during the Pap test can be tested for HPV.  The HPV test can be used to screen women 30 years of age and older. Getting tested for HPV can extend the interval between normal Pap   tests from three to five years.  An HPV test also should be used to screen women of any age who have unclear Pap test results.  After 38 years of age, women should have HPV testing as often as Pap tests.  Colorectal Cancer  This type of cancer can be detected and often prevented.  Routine colorectal cancer screening usually begins at 38 years of age and continues through 38 years of age.  Your health care provider may recommend screening at an earlier age if you have risk factors for colon cancer.  Your health care provider may also recommend using home test kits to check for hidden blood in the stool.  A small camera at the end of a tube can be used to examine your colon directly  (sigmoidoscopy or colonoscopy). This is done to check for the earliest forms of colorectal cancer.  Routine screening usually begins at age 50.  Direct examination of the colon should be repeated every 5-10 years through 38 years of age. However, you may need to be screened more often if early forms of precancerous polyps or small growths are found. Skin Cancer  Check your skin from head to toe regularly.  Tell your health care provider about any new moles or changes in moles, especially if there is a change in a mole's shape or color.  Also tell your health care provider if you have a mole that is larger than the size of a pencil eraser.  Always use sunscreen. Apply sunscreen liberally and repeatedly throughout the day.  Protect yourself by wearing long sleeves, pants, a wide-brimmed hat, and sunglasses whenever you are outside. HEART DISEASE, DIABETES, AND HIGH BLOOD PRESSURE   Have your blood pressure checked at least every 1-2 years. High blood pressure causes heart disease and increases the risk of stroke.  If you are between 55 years and 79 years old, ask your health care provider if you should take aspirin to prevent strokes.  Have regular diabetes screenings. This involves taking a blood sample to check your fasting blood sugar level.  If you are at a normal weight and have a low risk for diabetes, have this test once every three years after 38 years of age.  If you are overweight and have a high risk for diabetes, consider being tested at a younger age or more often. PREVENTING INFECTION  Hepatitis B  If you have a higher risk for hepatitis B, you should be screened for this virus. You are considered at high risk for hepatitis B if:  You were born in a country where hepatitis B is common. Ask your health care provider which countries are considered high risk.  Your parents were born in a high-risk country, and you have not been immunized against hepatitis B (hepatitis B  vaccine).  You have HIV or AIDS.  You use needles to inject street drugs.  You live with someone who has hepatitis B.  You have had sex with someone who has hepatitis B.  You get hemodialysis treatment.  You take certain medicines for conditions, including cancer, organ transplantation, and autoimmune conditions. Hepatitis C  Blood testing is recommended for:  Everyone born from 1945 through 1965.  Anyone with known risk factors for hepatitis C. Sexually transmitted infections (STIs)  You should be screened for sexually transmitted infections (STIs) including gonorrhea and chlamydia if:  You are sexually active and are younger than 38 years of age.  You are older than 38 years of age and   your health care provider tells you that you are at risk for this type of infection.  Your sexual activity has changed since you were last screened and you are at an increased risk for chlamydia or gonorrhea. Ask your health care provider if you are at risk.  If you do not have HIV, but are at risk, it may be recommended that you take a prescription medicine daily to prevent HIV infection. This is called pre-exposure prophylaxis (PrEP). You are considered at risk if:  You are sexually active and do not regularly use condoms or know the HIV status of your partner(s).  You take drugs by injection.  You are sexually active with a partner who has HIV. Talk with your health care provider about whether you are at high risk of being infected with HIV. If you choose to begin PrEP, you should first be tested for HIV. You should then be tested every 3 months for as long as you are taking PrEP.  PREGNANCY   If you are premenopausal and you may become pregnant, ask your health care provider about preconception counseling.  If you may become pregnant, take 400 to 800 micrograms (mcg) of folic acid every day.  If you want to prevent pregnancy, talk to your health care provider about birth control  (contraception). OSTEOPOROSIS AND MENOPAUSE   Osteoporosis is a disease in which the bones lose minerals and strength with aging. This can result in serious bone fractures. Your risk for osteoporosis can be identified using a bone density scan.  If you are 65 years of age or older, or if you are at risk for osteoporosis and fractures, ask your health care provider if you should be screened.  Ask your health care provider whether you should take a calcium or vitamin D supplement to lower your risk for osteoporosis.  Menopause may have certain physical symptoms and risks.  Hormone replacement therapy may reduce some of these symptoms and risks. Talk to your health care provider about whether hormone replacement therapy is right for you.  HOME CARE INSTRUCTIONS   Schedule regular health, dental, and eye exams.  Stay current with your immunizations.   Do not use any tobacco products including cigarettes, chewing tobacco, or electronic cigarettes.  If you are pregnant, do not drink alcohol.  If you are breastfeeding, limit how much and how often you drink alcohol.  Limit alcohol intake to no more than 1 drink per day for nonpregnant women. One drink equals 12 ounces of beer, 5 ounces of wine, or 1 ounces of hard liquor.  Do not use street drugs.  Do not share needles.  Ask your health care provider for help if you need support or information about quitting drugs.  Tell your health care provider if you often feel depressed.  Tell your health care provider if you have ever been abused or do not feel safe at home. Document Released: 08/29/2010 Document Revised: 06/30/2013 Document Reviewed: 01/15/2013 [employer] Patient Information 2015 [employer]. This information is not intended to replace advice given to you by your health care provider. Make sure you discuss any questions you have with your health care provider.  

## 2015-09-03 DIAGNOSIS — H40033 Anatomical narrow angle, bilateral: Secondary | ICD-10-CM | POA: Diagnosis not present

## 2015-09-03 DIAGNOSIS — H1013 Acute atopic conjunctivitis, bilateral: Secondary | ICD-10-CM | POA: Diagnosis not present

## 2015-10-27 ENCOUNTER — Ambulatory Visit (INDEPENDENT_AMBULATORY_CARE_PROVIDER_SITE_OTHER): Payer: BLUE CROSS/BLUE SHIELD | Admitting: Gynecology

## 2015-10-27 ENCOUNTER — Encounter: Payer: Self-pay | Admitting: Gynecology

## 2015-10-27 VITALS — BP 118/76 | Ht 62.5 in | Wt 157.0 lb

## 2015-10-27 DIAGNOSIS — N920 Excessive and frequent menstruation with regular cycle: Secondary | ICD-10-CM | POA: Diagnosis not present

## 2015-10-27 DIAGNOSIS — Z01419 Encounter for gynecological examination (general) (routine) without abnormal findings: Secondary | ICD-10-CM

## 2015-10-27 LAB — CBC WITH DIFFERENTIAL/PLATELET
BASOS ABS: 90 {cells}/uL (ref 0–200)
Basophils Relative: 1 %
EOS ABS: 270 {cells}/uL (ref 15–500)
Eosinophils Relative: 3 %
HEMATOCRIT: 47.6 % — AB (ref 35.0–45.0)
Hemoglobin: 16.1 g/dL — ABNORMAL HIGH (ref 11.7–15.5)
LYMPHS PCT: 36 %
Lymphs Abs: 3240 cells/uL (ref 850–3900)
MCH: 30.6 pg (ref 27.0–33.0)
MCHC: 33.8 g/dL (ref 32.0–36.0)
MCV: 90.3 fL (ref 80.0–100.0)
MONO ABS: 720 {cells}/uL (ref 200–950)
MONOS PCT: 8 %
MPV: 10.6 fL (ref 7.5–12.5)
Neutro Abs: 4680 cells/uL (ref 1500–7800)
Neutrophils Relative %: 52 %
PLATELETS: 333 10*3/uL (ref 140–400)
RBC: 5.27 MIL/uL — ABNORMAL HIGH (ref 3.80–5.10)
RDW: 13 % (ref 11.0–15.0)
WBC: 9 10*3/uL (ref 3.8–10.8)

## 2015-10-27 LAB — TSH: TSH: 0.96 mIU/L

## 2015-10-27 NOTE — Patient Instructions (Signed)
Follow up for ultrasound as scheduled.  You may obtain a copy of any labs that were done today by logging onto MyChart as outlined in the instructions provided with your AVS (after visit summary). The office will not call with normal lab results but certainly if there are any significant abnormalities then we will contact you.   Health Maintenance Adopting a healthy lifestyle and getting preventive care can go a long way to promote health and wellness. Talk with your health care provider about what schedule of regular examinations is right for you. This is a good chance for you to check in with your provider about disease prevention and staying healthy. In between checkups, there are plenty of things you can do on your own. Experts have done a lot of research about which lifestyle changes and preventive measures are most likely to keep you healthy. Ask your health care provider for more information. WEIGHT AND DIET  Eat a healthy diet  Be sure to include plenty of vegetables, fruits, low-fat dairy products, and lean protein.  Do not eat a lot of foods high in solid fats, added sugars, or salt.  Get regular exercise. This is one of the most important things you can do for your health.  Most adults should exercise for at least 150 minutes each week. The exercise should increase your heart rate and make you sweat (moderate-intensity exercise).  Most adults should also do strengthening exercises at least twice a week. This is in addition to the moderate-intensity exercise.  Maintain a healthy weight  Body mass index (BMI) is a measurement that can be used to identify possible weight problems. It estimates body fat based on height and weight. Your health care provider can help determine your BMI and help you achieve or maintain a healthy weight.  For females 74 years of age and older:   A BMI below 18.5 is considered underweight.  A BMI of 18.5 to 24.9 is normal.  A BMI of 25 to 29.9 is  considered overweight.  A BMI of 30 and above is considered obese.  Watch levels of cholesterol and blood lipids  You should start having your blood tested for lipids and cholesterol at 39 years of age, then have this test every 5 years.  You may need to have your cholesterol levels checked more often if:  Your lipid or cholesterol levels are high.  You are older than 39 years of age.  You are at high risk for heart disease.  CANCER SCREENING   Lung Cancer  Lung cancer screening is recommended for adults 24-9 years old who are at high risk for lung cancer because of a history of smoking.  A yearly low-dose CT scan of the lungs is recommended for people who:  Currently smoke.  Have quit within the past 15 years.  Have at least a 30-pack-year history of smoking. A pack year is smoking an average of one pack of cigarettes a day for 1 year.  Yearly screening should continue until it has been 15 years since you quit.  Yearly screening should stop if you develop a health problem that would prevent you from having lung cancer treatment.  Breast Cancer  Practice breast self-awareness. This means understanding how your breasts normally appear and feel.  It also means doing regular breast self-exams. Let your health care provider know about any changes, no matter how small.  If you are in your 20s or 30s, you should have a clinical breast exam (CBE)  by a health care provider every 1-3 years as part of a regular health exam.  If you are 22 or older, have a CBE every year. Also consider having a breast X-ray (mammogram) every year.  If you have a family history of breast cancer, talk to your health care provider about genetic screening.  If you are at high risk for breast cancer, talk to your health care provider about having an MRI and a mammogram every year.  Breast cancer gene (BRCA) assessment is recommended for women who have family members with BRCA-related cancers.  BRCA-related cancers include:  Breast.  Ovarian.  Tubal.  Peritoneal cancers.  Results of the assessment will determine the need for genetic counseling and BRCA1 and BRCA2 testing. Cervical Cancer Routine pelvic examinations to screen for cervical cancer are no longer recommended for nonpregnant women who are considered low risk for cancer of the pelvic organs (ovaries, uterus, and vagina) and who do not have symptoms. A pelvic examination may be necessary if you have symptoms including those associated with pelvic infections. Ask your health care provider if a screening pelvic exam is right for you.   The Pap test is the screening test for cervical cancer for women who are considered at risk.  If you had a hysterectomy for a problem that was not cancer or a condition that could lead to cancer, then you no longer need Pap tests.  If you are older than 65 years, and you have had normal Pap tests for the past 10 years, you no longer need to have Pap tests.  If you have had past treatment for cervical cancer or a condition that could lead to cancer, you need Pap tests and screening for cancer for at least 20 years after your treatment.  If you no longer get a Pap test, assess your risk factors if they change (such as having a new sexual partner). This can affect whether you should start being screened again.  Some women have medical problems that increase their chance of getting cervical cancer. If this is the case for you, your health care provider may recommend more frequent screening and Pap tests.  The human papillomavirus (HPV) test is another test that may be used for cervical cancer screening. The HPV test looks for the virus that can cause cell changes in the cervix. The cells collected during the Pap test can be tested for HPV.  The HPV test can be used to screen women 39 years of age and older. Getting tested for HPV can extend the interval between normal Pap tests from three to  five years.  An HPV test also should be used to screen women of any age who have unclear Pap test results.  After 39 years of age, women should have HPV testing as often as Pap tests.  Colorectal Cancer  This type of cancer can be detected and often prevented.  Routine colorectal cancer screening usually begins at 39 years of age and continues through 39 years of age.  Your health care provider may recommend screening at an earlier age if you have risk factors for colon cancer.  Your health care provider may also recommend using home test kits to check for hidden blood in the stool.  A small camera at the end of a tube can be used to examine your colon directly (sigmoidoscopy or colonoscopy). This is done to check for the earliest forms of colorectal cancer.  Routine screening usually begins at age 58.  Direct  examination of the colon should be repeated every 5-10 years through 39 years of age. However, you may need to be screened more often if early forms of precancerous polyps or small growths are found. Skin Cancer  Check your skin from head to toe regularly.  Tell your health care provider about any new moles or changes in moles, especially if there is a change in a mole's shape or color.  Also tell your health care provider if you have a mole that is larger than the size of a pencil eraser.  Always use sunscreen. Apply sunscreen liberally and repeatedly throughout the day.  Protect yourself by wearing long sleeves, pants, a wide-brimmed hat, and sunglasses whenever you are outside. HEART DISEASE, DIABETES, AND HIGH BLOOD PRESSURE   Have your blood pressure checked at least every 1-2 years. High blood pressure causes heart disease and increases the risk of stroke.  If you are between 26 years and 34 years old, ask your health care provider if you should take aspirin to prevent strokes.  Have regular diabetes screenings. This involves taking a blood sample to check your  fasting blood sugar level.  If you are at a normal weight and have a low risk for diabetes, have this test once every three years after 39 years of age.  If you are overweight and have a high risk for diabetes, consider being tested at a younger age or more often. PREVENTING INFECTION  Hepatitis B  If you have a higher risk for hepatitis B, you should be screened for this virus. You are considered at high risk for hepatitis B if:  You were born in a country where hepatitis B is common. Ask your health care provider which countries are considered high risk.  Your parents were born in a high-risk country, and you have not been immunized against hepatitis B (hepatitis B vaccine).  You have HIV or AIDS.  You use needles to inject street drugs.  You live with someone who has hepatitis B.  You have had sex with someone who has hepatitis B.  You get hemodialysis treatment.  You take certain medicines for conditions, including cancer, organ transplantation, and autoimmune conditions. Hepatitis C  Blood testing is recommended for:  Everyone born from [birth year] through 1964.  Anyone with known risk factors for hepatitis C. Sexually transmitted infections (STIs)  You should be screened for sexually transmitted infections (STIs) including gonorrhea and chlamydia if:  You are sexually active and are younger than 39 years of age.  You are older than 39 years of age and your health care provider tells you that you are at risk for this type of infection.  Your sexual activity has changed since you were last screened and you are at an increased risk for chlamydia or gonorrhea. Ask your health care provider if you are at risk.  If you do not have HIV, but are at risk, it may be recommended that you take a prescription medicine daily to prevent HIV infection. This is called pre-exposure prophylaxis (PrEP). You are considered at risk if:  You are sexually active and do not regularly use condoms or  know the HIV status of your partner(s).  You take drugs by injection.  You are sexually active with a partner who has HIV. Talk with your health care provider about whether you are at high risk of being infected with HIV. If you choose to begin PrEP, you should first be tested for HIV. You should then be tested  every 3 months for as long as you are taking PrEP.  PREGNANCY   If you are premenopausal and you may become pregnant, ask your health care provider about preconception counseling.  If you may become pregnant, take 400 to 800 micrograms (mcg) of folic acid every day.  If you want to prevent pregnancy, talk to your health care provider about birth control (contraception). OSTEOPOROSIS AND MENOPAUSE   Osteoporosis is a disease in which the bones lose minerals and strength with aging. This can result in serious bone fractures. Your risk for osteoporosis can be identified using a bone density scan.  If you are 67 years of age or older, or if you are at risk for osteoporosis and fractures, ask your health care provider if you should be screened.  Ask your health care provider whether you should take a calcium or vitamin D supplement to lower your risk for osteoporosis.  Menopause may have certain physical symptoms and risks.  Hormone replacement therapy may reduce some of these symptoms and risks. Talk to your health care provider about whether hormone replacement therapy is right for you.  HOME CARE INSTRUCTIONS   Schedule regular health, dental, and eye exams.  Stay current with your immunizations.   Do not use any tobacco products including cigarettes, chewing tobacco, or electronic cigarettes.  If you are pregnant, do not drink alcohol.  If you are breastfeeding, limit how much and how often you drink alcohol.  Limit alcohol intake to no more than 1 drink per day for nonpregnant women. One drink equals 12 ounces of beer, 5 ounces of wine, or 1 ounces of hard liquor.  Do  not use street drugs.  Do not share needles.  Ask your health care provider for help if you need support or information about quitting drugs.  Tell your health care provider if you often feel depressed.  Tell your health care provider if you have ever been abused or do not feel safe at home. Document Released: 08/29/2010 Document Revised: 06/30/2013 Document Reviewed: 01/15/2013 [facility] Patient Information 2015 [place], [place]. This information is not intended to replace advice given to you by your health care provider. Make sure you discuss any questions you have with your health care provider.

## 2015-10-27 NOTE — Progress Notes (Signed)
    Caroline Bridges 12-Sep-1975 [id number]        40 y.o.  G3P3003  for annual exam.  Several issues noted below.  Past medical history,surgical history, problem list, medications, allergies, family history and social history were all reviewed and documented as reviewed in the EPIC chart.  ROS:  Performed with pertinent positives and negatives included in the history, assessment and plan.   Additional significant findings :  None   Exam: Kennon PortelaKim Gardner assistant Vitals:   10/27/15 1535  BP: 118/76  Weight: 157 lb (71.2 kg)  Height: 5' 2.5" (1.588 m)   Body mass index is 28.26 kg/m.  General appearance:  Normal affect, orientation and appearance. Skin: Grossly normal HEENT: Without gross lesions.  No cervical or supraclavicular adenopathy. Thyroid normal.  Lungs:  Clear without wheezing, rales or rhonchi Cardiac: RR, without RMG Abdominal:  Soft, nontender, without masses, guarding, rebound, organomegaly or hernia Breasts:  Examined lying and sitting without masses, retractions, discharge or axillary adenopathy. Pelvic:  Ext/BUS/Vagina normal  Cervix with LEEP scarring  Uterus anteverted, normal size, shape and contour, midline and mobile nontender   Adnexa without masses or tenderness    Anus and perineum normal   Rectovaginal normal sphincter tone without palpated masses or tenderness.    Assessment/Plan:  40 y.o. 493P3003 female for annual exam with regular heavy menses, tubal sterilization.   1. Menorrhagia. Patient notes of the past year her menses have progressively gotten heavier where now she will have bleedthrough episodes despite double protection for the first day or 2. No bleeding in between. Reviewed differential to include pathology versus hormonal dysfunction. Possibilities to include leiomyoma polyps or no pathology found. Treatment options to include hormonal manipulation, Mirena IUD suppression, endometrial ablation, hysterectomy all discussed. Patient states that  she would lean towards ablation which I think she is a good candidate given her history of tubal sterilization. Will start with baseline CBC for anemia check, TSH for thyroid checked and sonohysterogram. Patient will follow up for this and then we'll further discuss treatment options. 2. Pap smear/HPV negative 2014. No Pap smear done today. History of cryosurgery 2000 and LEEP 2009 with negative Pap smears since. Plan repeat Pap smear approaching 5 year interval per current screening guidelines. 3. Breast health. SBE monthly reviewed. Screening mammographic recommendations between 35 and 40 reviewed. No strong family history and we'll plan on after 40. 4. Health maintenance. No routine lab work done as patient reports is done elsewhere. Follow up 1 year, sooner as needed.  15 minutes of my time in excess of her routine gynecologic exam was spent in direct face to face counseling and coordination of care in regards to her problems of menorrhagia with differential possibilities, workup and treatment options.    Dara LordsFONTAINE,Hanford Lust P MD, 4:10 PM 10/27/2015

## 2015-11-08 ENCOUNTER — Telehealth: Payer: Self-pay | Admitting: Gynecology

## 2015-11-08 ENCOUNTER — Other Ambulatory Visit: Payer: Self-pay | Admitting: Gynecology

## 2015-11-08 DIAGNOSIS — N939 Abnormal uterine and vaginal bleeding, unspecified: Secondary | ICD-10-CM

## 2015-11-08 NOTE — Telephone Encounter (Signed)
[employer] insurance imply'sthe cost of the sonohysterogram will be covered 100 percent of allowed amount,pt responsible for $50 co pay [phone]

## 2015-11-22 ENCOUNTER — Telehealth: Payer: Self-pay

## 2015-11-22 ENCOUNTER — Ambulatory Visit (INDEPENDENT_AMBULATORY_CARE_PROVIDER_SITE_OTHER): Payer: BLUE CROSS/BLUE SHIELD

## 2015-11-22 ENCOUNTER — Ambulatory Visit (INDEPENDENT_AMBULATORY_CARE_PROVIDER_SITE_OTHER): Payer: BLUE CROSS/BLUE SHIELD | Admitting: Gynecology

## 2015-11-22 VITALS — BP 114/66

## 2015-11-22 DIAGNOSIS — N939 Abnormal uterine and vaginal bleeding, unspecified: Secondary | ICD-10-CM

## 2015-11-22 DIAGNOSIS — N920 Excessive and frequent menstruation with regular cycle: Secondary | ICD-10-CM | POA: Diagnosis not present

## 2015-11-22 NOTE — Progress Notes (Signed)
    Alyce Paganmber N Goehring 1975-04-14 [id number]        40 y.o.  G3P3003 tenths for sonohysterogram.  History of progressively worsening menorrhagia requiring double protection for first day or 2. No bleeding in between. Recent TSH normal. Recent hemoglobin 16.  Past medical history,surgical history, problem list, medications, allergies, family history and social history were all reviewed and documented in the EPIC chart.  Directed ROS with pertinent positives and negatives documented in the history of present illness/assessment and plan.  Exam: [place] assistant Vitals:   11/22/15 1013  BP: 114/66   General appearance:  Normal Abdomen soft nontender without masses guarding rebound or gadolinium megaly Pelvic external BUS vagina normal. Cervix with LEEP scarring. Uterus normal size midline mobile nontender. Adnexa without masses or tenderness.  Ultrasound shows uterus normal size and echotexture. Endometrial echo 7.2 mm. Right and left ovaries normal. Cul-de-sac negative.  Sonohysterogram performed, sterile technique, easy catheter introduction, good distention with no abnormalities. Endometrial biopsy taken. Patient tolerated well.  Assessment/Plan:  40 y.o. [id number] with worsening menorrhagia. Good hemoglobin recovery period ultrasound/sonohysterogram normal. Patient will follow up for biopsy results. I again reviewed options to include observation, hormonal manipulation, Lysteda, Mirena IUD, endometrial ablation, hysterectomy. The pros and cons of each choice discussed. Patient wants to proceed with HerOption endometrial ablation. I discussed in general which involved with the procedure. I discussed that there are failures and no guarantees as far as menorrhagia relief. I also reviewed she should never pursue pregnancy trial following this and that it could be dangerous if she did achieve pregnancy. Patient is status post tubal sterilization. She understands and agrees with this. She will go ahead  and schedule this and follow up for this with a preoperative appointment.    Dara LordsFONTAINE,Chosen Geske P MD, 10:23 AM 11/22/2015

## 2015-11-22 NOTE — Patient Instructions (Addendum)
Office will call you to arrange for the endometrial ablation.  Office will call you with biopsy results from the ultrasound

## 2015-11-22 NOTE — Telephone Encounter (Signed)
I called and left message for patient to call.

## 2015-11-23 ENCOUNTER — Other Ambulatory Visit: Payer: Self-pay | Admitting: Gynecology

## 2015-11-23 ENCOUNTER — Telehealth: Payer: Self-pay

## 2015-11-23 DIAGNOSIS — N920 Excessive and frequent menstruation with regular cycle: Secondary | ICD-10-CM

## 2015-11-23 MED ORDER — MISOPROSTOL 200 MCG PO TABS: ORAL_TABLET | ORAL | 0 refills | Status: DC

## 2015-11-23 NOTE — Telephone Encounter (Signed)
Also, Patient to call me Day One of cycle. She will need to start Prometrium 200 mg. On Day 3 and take daily until her procedure on 12/27/2015.

## 2015-11-23 NOTE — Telephone Encounter (Signed)
Patient called me back. We discussed scheduling ablation. She anticipates her next period around mid Oct and it usually lasts 3-7 days.  We scheduled her for 12/27/15 at 9:00am.  I reviewed with her need for full bladder/instructions, someone to drive her to and from procedure and need for Cytotec vaginally hs before procedure.  Rx sent.

## 2015-11-23 NOTE — Telephone Encounter (Signed)
Appt desk will contact her to schedule pre and post op appts.

## 2015-11-23 NOTE — Telephone Encounter (Signed)
Also, discussed ins benefits with patient and $50 copymt then 100% for ablation in office.

## 2015-12-13 ENCOUNTER — Telehealth: Payer: Self-pay

## 2015-12-13 MED ORDER — PROGESTERONE MICRONIZED 200 MG PO CAPS: ORAL_CAPSULE | ORAL | 0 refills | Status: DC

## 2015-12-13 NOTE — Telephone Encounter (Signed)
Patient is scheduled for Her Option ablation. She is calling with Day one today. Prometrium Rx sent. I left her detailed message in voice mail per DPR access note reminding her to take hs beginning Day 3 of her period until surgery.

## 2015-12-22 ENCOUNTER — Encounter: Payer: Self-pay | Admitting: Gynecology

## 2015-12-22 ENCOUNTER — Ambulatory Visit (INDEPENDENT_AMBULATORY_CARE_PROVIDER_SITE_OTHER): Payer: BLUE CROSS/BLUE SHIELD | Admitting: Gynecology

## 2015-12-22 VITALS — BP 112/64

## 2015-12-22 DIAGNOSIS — N920 Excessive and frequent menstruation with regular cycle: Secondary | ICD-10-CM | POA: Diagnosis not present

## 2015-12-22 MED ORDER — DIAZEPAM 5 MG PO TABS: 5.0000 mg | ORAL_TABLET | Freq: Four times a day (QID) | ORAL | 0 refills | Status: DC | PRN

## 2015-12-22 MED ORDER — SULFAMETHOXAZOLE-TRIMETHOPRIM 800-160 MG PO TABS: 1.0000 | ORAL_TABLET | Freq: Once | ORAL | 0 refills | Status: AC

## 2015-12-22 MED ORDER — HYDROCODONE-ACETAMINOPHEN 5-325 MG PO TABS: 1.0000 | ORAL_TABLET | Freq: Four times a day (QID) | ORAL | 0 refills | Status: DC | PRN

## 2015-12-22 NOTE — Progress Notes (Signed)
    Alyce Paganmber N Horiuchi 1975/04/08 [id number]        40 y.o.  G3P3003 presents for preoperative consult for upcoming HerOption endometrial ablation. History of worsening menorrhagia requiring double protection for the first several days. Bleedthrough episodes noted. No bleeding in between. Recent TSH and hemoglobin normal. Status post tubal sterilization in the past.  sonohysterogram performed showed a normal uterus and normal endometrial cavity.  Endometrial biopsy showed benign proliferative endometrium.  Options for management were reviewed to include observation, hormonal manipulation, Lysteda, Mirena IUD, endometrial ablation and hysterectomy.  Patient elects for endometrial ablation.  Past medical history,surgical history, problem list, medications, allergies, family history and social history were all reviewed and documented in the EPIC chart.  Directed ROS with pertinent positives and negatives documented in the history of present illness/assessment and plan.  Exam: Kennon PortelaKim Gardner assistant Vitals:   12/22/15 0937  BP: 112/64   General appearance:  Normal Abdomen soft nontender without masses guarding rebound Pelvic external BUS vagina normal. Cervix normal. Uterus normal size midline mobile nontender. Adnexa without masses or tenderness.  Assessment/Plan:  40 y.o. G3P3003 with worsening menorrhagia, normal sonohysterogram and biopsy, normal hemoglobin with TSH and status post tubal sterilization. I reviewed HerOption endometrial ablation with her to include the intraoperative and postoperative courses. I reviewed the risks to include infection, prolonged antibiotics, bleeding possibly requiring transfusion, damage to internal organs including vagina, cervix, uterus and perforation with damage to internal organs including bowel, bladder, ureters, vessels and nerves either direct perforation or transit uterine cryo-damage necessitating major exploratory reparative surgeries and future reparative  surgeries including bowel resection, bladder, ureteral damage repair possible ostomy formation and future reparative surgeries. Patient clearly understands that she should never pursue pregnancy following this procedure. She does have a tubal ligation but she understands regardless she should never try to be pregnant and that her menorrhagia may continue or worsen following the procedure. She understands that she more than likely will continue to have periods but hopefully lighter and again no guarantees as far as outcome. The patient's questions were answered to her satisfaction. I if her prescriptions for her medications include Valium 5 mg #5,, Septra DS 1 by mouth a.m. of surgery and a.m. following surgery. She has a prescription for her Cytotec to be used tonight before. I reviewed how to take her medications and answered her questions. Patient read through the consent form and signed it and this was scanned into Epic. The patient will follow up for her procedure as scheduled beginning of next week.    Dara LordsFONTAINE,TIMOTHY P MD, 10:17 AM 12/22/2015

## 2015-12-22 NOTE — Patient Instructions (Signed)
Follow up for endometrial ablation as scheduled 

## 2015-12-27 ENCOUNTER — Encounter: Payer: Self-pay | Admitting: Gynecology

## 2015-12-27 ENCOUNTER — Ambulatory Visit (INDEPENDENT_AMBULATORY_CARE_PROVIDER_SITE_OTHER): Payer: BLUE CROSS/BLUE SHIELD

## 2015-12-27 ENCOUNTER — Ambulatory Visit (INDEPENDENT_AMBULATORY_CARE_PROVIDER_SITE_OTHER): Payer: BLUE CROSS/BLUE SHIELD | Admitting: Gynecology

## 2015-12-27 VITALS — BP 114/64

## 2015-12-27 DIAGNOSIS — R102 Pelvic and perineal pain: Secondary | ICD-10-CM | POA: Diagnosis not present

## 2015-12-27 DIAGNOSIS — N920 Excessive and frequent menstruation with regular cycle: Secondary | ICD-10-CM

## 2015-12-27 MED ORDER — KETOROLAC TROMETHAMINE 30 MG/ML IJ SOLN
60.0000 mg | Freq: Once | INTRAMUSCULAR | Status: AC
  Administered 2015-12-27: 60 mg via INTRAMUSCULAR

## 2015-12-27 MED ORDER — LIDOCAINE HCL 1 % IJ SOLN
10.0000 mL | Freq: Once | INTRAMUSCULAR | Status: AC
  Administered 2015-12-27: 10 mL

## 2015-12-27 NOTE — Progress Notes (Signed)
HER OPTION ENDOMETRIAL ABLATION PROCEDURAL NOTE    Caroline Bridges [birth year] [id number]   12/27/2015  Diagnosis:  Excessive uterine bleeding / Menorrhagia  Procedure:  Endometrial cryoablation with intraoperative ultrasonic guidance  Procedure:  The patient was brought to the treatment room having previously been counseled for the procedure and having signed the consent form that is scanned into Epic. Pre procedural medications received were Toradol 30 mg IM, Valium 5 mg by mouth, hydrocodone/acetaminophen 5/235 1 tab by mouth.  The patient was placed in the dorsal lithotomy position and a speculum was inserted. The cervix and upper vagina were cleansed with Betadine. A single-tooth tenaculum was placed on the anterior lip of the cervix. A  paracervical block was placed Yes.   using 10 cc's of 1% lidocaine.  The uterus was Yes.   sounded 8 centimeters. Cervical dilatation performed No. . Under ultrasound guidance, the Her Option probe was introduced into the uterine cavity after the preprocedural sequence was performed. After assuring proper cornual placement, cryoablation was then performed under continuous ultrasound guidance monitoring the growth of the cryo-zone. Sequential cryoablation's were performed in the following order:   Location   Length of Time Myometrial Depth 1. Right cornua   6 minutes  AP 10.6 mm, transverse 8.8 mm 2. Left cornua   6 minutes  AP 7.9 mm, transverse 5.2 mm   Upon completion of the procedure the instruments were removed, hemostasis visualized the patient was assisted to the bathroom and then to another exam room where she was observed. The patient tolerated the procedure well and was released in stable condition with her driver along with a copy of the postprocedural instructions and precautions which were reviewed with her. She is to return to the office in 2 weeks for post procedural check and received an appointment date and time.    Dara LordsFONTAINE,Dashanique Brownstein P MD,  10:00 AM 12/27/2015

## 2015-12-27 NOTE — Patient Instructions (Signed)
Follow up in 2 weeks for postoperative visit 

## 2016-01-10 ENCOUNTER — Encounter: Payer: Self-pay | Admitting: Gynecology

## 2016-01-10 ENCOUNTER — Ambulatory Visit (INDEPENDENT_AMBULATORY_CARE_PROVIDER_SITE_OTHER): Payer: BLUE CROSS/BLUE SHIELD | Admitting: Gynecology

## 2016-01-10 VITALS — BP 112/70

## 2016-01-10 DIAGNOSIS — Z9889 Other specified postprocedural states: Secondary | ICD-10-CM

## 2016-01-10 NOTE — Progress Notes (Signed)
    Caroline Bridges [birth year] [id number]        40 y.o.  G3P3003 presents for postoperative follow up status post HerOption endometrial ablation. Doing well without complaints  Past medical history,surgical history, problem list, medications, allergies, family history and social history were all reviewed and documented in the EPIC chart.  Directed ROS with pertinent positives and negatives documented in the history of present illness/assessment and plan.  Exam: Kennon PortelaKim Gardner assistant Vitals:   01/10/16 0854  BP: 112/70   General appearance:  Normal Abdomen soft nontender without masses guarding rebound Pelvic external BUS vagina normal. Cervix normal. Uterus normal size midline mobile nontender. Adnexa without masses or tenderness.  Assessment/Plan:  40 y.o. [id number] with normal postoperative visit status post HerOption endometrial ablation. Will keep menstrual calendar and as long as acceptable follow up in August 2018 when due for annual exam. Sooner if any issues.    Dara LordsFONTAINE,TIMOTHY P MD, 9:12 AM 01/10/2016

## 2016-01-10 NOTE — Patient Instructions (Signed)
Follow up in August 2018 for annual exam, sooner if any issues.

## 2016-01-12 ENCOUNTER — Telehealth: Payer: Self-pay | Admitting: Family Medicine

## 2016-10-27 ENCOUNTER — Ambulatory Visit (INDEPENDENT_AMBULATORY_CARE_PROVIDER_SITE_OTHER): Payer: BLUE CROSS/BLUE SHIELD | Admitting: Gynecology

## 2016-10-27 ENCOUNTER — Encounter: Payer: Self-pay | Admitting: Gynecology

## 2016-10-27 VITALS — BP 116/76 | Ht 62.5 in | Wt 167.0 lb

## 2016-10-27 DIAGNOSIS — Z01419 Encounter for gynecological examination (general) (routine) without abnormal findings: Secondary | ICD-10-CM | POA: Diagnosis not present

## 2016-10-27 DIAGNOSIS — Z1151 Encounter for screening for human papillomavirus (HPV): Secondary | ICD-10-CM | POA: Diagnosis not present

## 2016-10-27 NOTE — Progress Notes (Signed)
    Caroline Bridges [birth year] [id number]        41 y.o.  G3P3003 for annual exam.  Doing well following her endometrial ablation.   Past medical history,surgical history, problem list, medications, allergies, family history and social history were all reviewed and documented as reviewed in the EPIC chart.  ROS:  Performed with pertinent positives and negatives included in the history, assessment and plan.   Additional significant findings :  None   Exam: Kennon PortelaKim Gardner assistant Vitals:   10/27/16 1005  BP: 116/76  Weight: 167 lb (75.8 kg)  Height: 5' 2.5" (1.588 m)   Body mass index is 30.06 kg/m.  General appearance:  Normal affect, orientation and appearance. Skin: Grossly normal HEENT: Without gross lesions.  No cervical or supraclavicular adenopathy. Thyroid normal.  Lungs:  Clear without wheezing, rales or rhonchi Cardiac: RR, without RMG Abdominal:  Soft, nontender, without masses, guarding, rebound, organomegaly or hernia Breasts:  Examined lying and sitting without masses, retractions, discharge or axillary adenopathy. Pelvic:  Ext, BUS, Vagina: Normal  Cervix: Normal with LEEP scarring. Pap smear/HPV  Uterus: Anteverted, normal size, shape and contour, midline and mobile nontender   Adnexa: Without masses or tenderness    Anus and perineum: Normal   Rectovaginal: Normal sphincter tone without palpated masses or tenderness.    Assessment/Plan:  41 y.o. 53P3003 female for annual exam with regular menses, tubal sterilization.   1. Pap smear/HPV 2014. Pap smear/HPV today. History of cryosurgery 2000 and LEEP 2009 with negative Pap smears since. 2. Recommended screening mammography this coming year as she turns 41 and she agrees to call and schedule. Breast exam normal today. 3. Health maintenance. No routine lab work done as patient reports this done at work. Follow up 1 year, sooner as needed.   Dara LordsFONTAINE,Bruno Leach P MD, 10:26 AM 10/27/2016

## 2016-10-27 NOTE — Patient Instructions (Signed)
Call to Schedule your mammogram this coming year  Facilities in [place]: 1)  [facility], [phone] Phone: [phone] 2)  [facility]Bertrand at [facility]  [address] Phone: [phone]     Mammogram A mammogram is an X-ray test to find changes in a woman's breast. You should get a mammogram if:  You are 40 years of age or older  You have risk factors.   Your doctor recommends that you have one.  BEFORE THE TEST  Do not schedule the test the week before your period, especially if your breasts are sore during this time.  On the day of your mammogram:  Wash your breasts and armpits well. After washing, do not put on any deodorant or talcum powder on until after your test.   Eat and drink as you usually do.   Take your medicines as usual.   If you are diabetic and take insulin, make sure you:   Eat before coming for your test.   Take your insulin as usual.   If you cannot keep your appointment, call before the appointment to cancel. Schedule another appointment.  TEST  You will need to undress from the waist up. You will put on a hospital gown.   Your breast will be put on the mammogram machine, and it will press firmly on your breast with a piece of plastic called a compression paddle. This will make your breast flatter so that the machine can X-ray all parts of your breast.   Both breasts will be X-rayed. Each breast will be X-rayed from above and from the side. An X-ray might need to be taken again if the picture is not good enough.   The mammogram will last about 15 to 30 minutes.  AFTER THE TEST Finding out the results of your test Ask when your test results will be ready. Make sure you get your test results.  Document Released: 05/12/2008 Document Revised: 02/02/2011 Document Reviewed: 05/12/2008 [facility] Patient Information 2012 [place], [place].

## 2016-10-27 NOTE — Addendum Note (Signed)
Addended by: Dayna BarkerGARDNER, Verba Ainley K on: 10/27/2016 10:48 AM   Modules accepted: Orders

## 2016-11-01 LAB — PAP IG AND HPV HIGH-RISK: HPV DNA HIGH RISK: NOT DETECTED

## 2017-11-20 DIAGNOSIS — H5210 Myopia, unspecified eye: Secondary | ICD-10-CM | POA: Diagnosis not present

## 2017-12-19 ENCOUNTER — Ambulatory Visit (INDEPENDENT_AMBULATORY_CARE_PROVIDER_SITE_OTHER): Payer: BLUE CROSS/BLUE SHIELD | Admitting: Gynecology

## 2017-12-19 ENCOUNTER — Encounter: Payer: Self-pay | Admitting: Gynecology

## 2017-12-19 VITALS — BP 118/76 | Ht 64.0 in | Wt 168.0 lb

## 2017-12-19 DIAGNOSIS — Z01419 Encounter for gynecological examination (general) (routine) without abnormal findings: Secondary | ICD-10-CM | POA: Diagnosis not present

## 2017-12-19 NOTE — Progress Notes (Signed)
    DYNASTEE BRUMMELL 07/16/1975 [id number]        42 y.o.  [id number] for annual gynecologic exam.  Without gynecologic complaints.  Past medical history,surgical history, problem list, medications, allergies, family history and social history were all reviewed and documented as reviewed in the EPIC chart.  ROS:  Performed with pertinent positives and negatives included in the history, assessment and plan.   Additional significant findings : None   Exam: Kennon Portela assistant Vitals:   12/19/17 0816  BP: 118/76  Weight: 168 lb (76.2 kg)  Height: 5\' 4"  (1.626 m)   Body mass index is 28.84 kg/m.  General appearance:  Normal affect, orientation and appearance. Skin: Grossly normal HEENT: Without gross lesions.  No cervical or supraclavicular adenopathy. Thyroid normal.  Lungs:  Clear without wheezing, rales or rhonchi Cardiac: RR, without RMG Abdominal:  Soft, nontender, without masses, guarding, rebound, organomegaly or hernia Breasts:  Examined lying and sitting without masses, retractions, discharge or axillary adenopathy. Pelvic:  Ext, BUS, Vagina: Normal  Cervix: Mild LEEP scarring noted  Uterus: Anteverted, normal size, shape and contour, midline and mobile nontender   Adnexa: Without masses or tenderness    Anus and perineum: Normal   Rectovaginal: Normal sphincter tone without palpated masses or tenderness.    Assessment/Plan:  42 y.o. G73P3003 female for annual gynecologic exam monthly menses, tubal sterilization.   1. Pap smear/HPV 2018.  No Pap smear done today.  History of cryosurgery 2000 and LEEP 2009 with normal Pap since.  Plan repeat Pap smear at 5-year interval per current screening guidelines. 2. Mammography never.  Recommended scheduling screening mammogram.  Names and numbers provided.  Breast exam normal today. 3. Health maintenance.  No routine lab work done as patient reports this done at work.  Follow-up 1 year, sooner as needed.   Dara Lords MD,  8:39 AM 12/19/2017

## 2017-12-19 NOTE — Patient Instructions (Signed)
Call to Schedule your mammogram  Facilities in [place]: 1)  [facility]. [facility], [address] Phone: [phone] 2)  Dr. Bertrand at [facility]  [address] Phone: [phone]     Mammogram A mammogram is an X-ray test to find changes in a woman's breast. You should get a mammogram if:  You are 40 years of age or older  You have risk factors.   Your doctor recommends that you have one.  BEFORE THE TEST  Do not schedule the test the week before your period, especially if your breasts are sore during this time.  On the day of your mammogram:  Wash your breasts and armpits well. After washing, do not put on any deodorant or talcum powder on until after your test.   Eat and drink as you usually do.   Take your medicines as usual.   If you are diabetic and take insulin, make sure you:   Eat before coming for your test.   Take your insulin as usual.   If you cannot keep your appointment, call before the appointment to cancel. Schedule another appointment.  TEST  You will need to undress from the waist up. You will put on a hospital gown.   Your breast will be put on the mammogram machine, and it will press firmly on your breast with a piece of plastic called a compression paddle. This will make your breast flatter so that the machine can X-ray all parts of your breast.   Both breasts will be X-rayed. Each breast will be X-rayed from above and from the side. An X-ray might need to be taken again if the picture is not good enough.   The mammogram will last about 15 to 30 minutes.  AFTER THE TEST Finding out the results of your test Ask when your test results will be ready. Make sure you get your test results.  Document Released: 05/12/2008 Document Revised: 02/02/2011 Document Reviewed: 05/12/2008 Exit[employer] Patient Information 2012 [employer], [employer].   

## 2018-01-31 ENCOUNTER — Other Ambulatory Visit: Payer: Self-pay | Admitting: Gynecology

## 2018-01-31 DIAGNOSIS — Z1231 Encounter for screening mammogram for malignant neoplasm of breast: Secondary | ICD-10-CM

## 2018-03-04 DIAGNOSIS — J209 Acute bronchitis, unspecified: Secondary | ICD-10-CM | POA: Diagnosis not present

## 2018-03-04 DIAGNOSIS — Z6830 Body mass index (BMI) 30.0-30.9, adult: Secondary | ICD-10-CM | POA: Diagnosis not present

## 2018-03-04 DIAGNOSIS — R05 Cough: Secondary | ICD-10-CM | POA: Diagnosis not present

## 2018-03-13 ENCOUNTER — Ambulatory Visit
Admission: RE | Admit: 2018-03-13 | Discharge: 2018-03-13 | Disposition: A | Discharge status: Home / Self Care | Payer: BLUE CROSS/BLUE SHIELD | Source: Ambulatory Visit | Attending: Gynecology | Admitting: Gynecology

## 2018-03-13 DIAGNOSIS — Z1231 Encounter for screening mammogram for malignant neoplasm of breast: Secondary | ICD-10-CM

## 2018-03-14 ENCOUNTER — Other Ambulatory Visit: Payer: Self-pay | Admitting: Gynecology

## 2018-03-14 DIAGNOSIS — R928 Other abnormal and inconclusive findings on diagnostic imaging of breast: Secondary | ICD-10-CM

## 2018-03-25 ENCOUNTER — Ambulatory Visit
Admission: RE | Admit: 2018-03-25 | Discharge: 2018-03-25 | Disposition: A | Discharge status: Home / Self Care | Payer: BLUE CROSS/BLUE SHIELD | Source: Ambulatory Visit | Attending: Gynecology | Admitting: Gynecology

## 2018-03-25 ENCOUNTER — Other Ambulatory Visit: Payer: Self-pay | Admitting: Gynecology

## 2018-03-25 DIAGNOSIS — N6310 Unspecified lump in the right breast, unspecified quadrant: Secondary | ICD-10-CM

## 2018-03-25 DIAGNOSIS — R928 Other abnormal and inconclusive findings on diagnostic imaging of breast: Secondary | ICD-10-CM

## 2018-03-25 DIAGNOSIS — N6311 Unspecified lump in the right breast, upper outer quadrant: Secondary | ICD-10-CM | POA: Diagnosis not present

## 2018-03-25 DIAGNOSIS — R922 Inconclusive mammogram: Secondary | ICD-10-CM | POA: Diagnosis not present

## 2018-04-02 ENCOUNTER — Ambulatory Visit
Admission: RE | Admit: 2018-04-02 | Discharge: 2018-04-02 | Disposition: A | Discharge status: Home / Self Care | Payer: BLUE CROSS/BLUE SHIELD | Source: Ambulatory Visit | Attending: Gynecology | Admitting: Gynecology

## 2018-04-02 DIAGNOSIS — N6011 Diffuse cystic mastopathy of right breast: Secondary | ICD-10-CM | POA: Diagnosis not present

## 2018-04-02 DIAGNOSIS — N6310 Unspecified lump in the right breast, unspecified quadrant: Secondary | ICD-10-CM

## 2018-04-02 DIAGNOSIS — N6311 Unspecified lump in the right breast, upper outer quadrant: Secondary | ICD-10-CM | POA: Diagnosis not present

## 2018-04-08 ENCOUNTER — Ambulatory Visit: Payer: Self-pay | Admitting: Surgery

## 2018-04-08 ENCOUNTER — Other Ambulatory Visit: Payer: Self-pay | Admitting: Surgery

## 2018-04-08 DIAGNOSIS — N6310 Unspecified lump in the right breast, unspecified quadrant: Secondary | ICD-10-CM

## 2018-04-08 NOTE — H&P (View-Only) (Signed)
Caroline Bridges Documented: 04/08/2018 11:43 AM Location: [facility] Patient #: [id number] DOB: 1975-11-08 Divorced / Language: [name] / Race: White Female  History of Present Illness Caroline Bridges A. Caroline Bridges; 04/08/2018 11:57 AM) Patient words: Patient sent at the request of Dr. Audie Box for abnormal mammogram. The patient underwent a screening mammogram which showed a density in her upper and lower quadrant of her right breast. Core biopsy was done which showed fibrocystic change but this was felt to be discordant by the radiologist. She states she had a maternal aunt with breast cancer but no one else in her family. The patient denies any history of breast pain, breast mass or nipple discharge.                        Diagnosis Breast, right, needle core biopsy, 9:30 o'clock - BENIGN BREAST PARENCHYMA WITH FIBROCYSTIC CHANGE. SEE NOTE. - NEGATIVE FOR CARCINOMA.Marland Kitchen           43 year old patient recalled from recent baseline screening mammogram for evaluation of a possible asymmetry in the right breast. This asymmetry was seen in the superior right breast on the MLO projection. It projects laterally on tomographic images.  EXAM: DIGITAL DIAGNOSTIC RIGHT MAMMOGRAM WITH TOMO  ULTRASOUND RIGHT BREAST  COMPARISON: Previous exam(s).  ACR Breast Density Category c: The breast tissue is heterogeneously dense, which may obscure small masses.  FINDINGS: Additional mammographic views performed today confirm possible mass with distortion in the posterior third of the upper-outer right breast.  On physical exam, the outer right breast is soft to palpation. I do not palpate a thickening or mass in the region of the findings on mammogram/ultrasound.  Targeted ultrasound is performed, showing an irregular hypoechoic mass with associated posterior acoustic shadowing. This mass is taller than wide and is at 9:30 position approximately 5 to 6 cm from the  nipple. This mass is best visualized with harmonics. The deep margin of the mass is difficult to delineate given the associated acoustical shadowing. The mass is estimated to be 0.8 x 1.6 x 0.9 cm.  Ultrasound of the right axilla is negative for lymphadenopathy.  IMPRESSION: Suspicious mass with associated distortion in the 9:30 position of the right breast. The mass is nonpalpable.  RECOMMENDATION: Ultrasound-guided core needle biopsy of the right breast is recommended and will be scheduled for the patient. The procedure for biopsy was discussed with the patient.  I have discussed the findings and recommendations with the patient. Results were also provided in writing at the conclusion of the visit. If applicable, a reminder letter will be sent to the patient regarding the next appointment.  BI-RADS CATEGORY 4: Suspicious.   Electronically Signed By: Britta Mccreedy M.D. On: 03/25/2018 13:16.  The patient is a 43 year old female.   Past Surgical History Caroline Bridges, CMA; 04/08/2018 11:43 AM) Breast Biopsy Right. Knee Surgery Left. Oral Surgery  Diagnostic Studies History Caroline Bridges, [place]; 04/08/2018 11:43 AM) Colonoscopy never Mammogram within last year  Allergies Caroline Bridges, CMA; 04/08/2018 11:45 AM) Biaxin *MACROLIDES* Codeine Phosphate *ANALGESICS - OPIOID* Morphine Derivatives Naproxen *ANALGESICS - ANTI-INFLAMMATORY* Allergies Reconciled  Medication History Caroline Bridges, CMA; 04/08/2018 11:45 AM) Medications Reconciled  Social History Caroline Bridges, CMA; 04/08/2018 11:43 AM) Alcohol use Occasional alcohol use. Caffeine use Carbonated beverages, Coffee. No drug use Tobacco use Current every day smoker.  Family History Caroline Bridges, [place]; 04/08/2018 11:43 AM) Arthritis Father, Mother, Son. Colon Polyps Father, Mother. Depression Mother. Diabetes Mellitus Father. Thyroid problems  Mother.  Pregnancy / Birth  History Caroline Bridges, [place]; 04/08/2018 11:43 AM) Age at menarche 12 years. Contraceptive History Depo-provera. Gravida 3 Length (months) of breastfeeding 3-6 Maternal age 62-20 Para 3 Regular periods  Other Problems Caroline Bridges, CMA; 04/08/2018 11:43 AM) Kidney Caroline Bridges     Review of Systems Caroline Bridges CMA; 04/08/2018 11:43 AM) General Not Present- Appetite Loss, Chills, Fatigue, Fever, Night Sweats, Weight Gain and Weight Loss. Skin Not Present- Change in Wart/Mole, Dryness, Hives, Jaundice, New Lesions, Non-Healing Wounds, Rash and Ulcer. HEENT Present- Wears glasses/contact lenses. Not Present- Earache, Hearing Loss, Hoarseness, Nose Bleed, Oral Ulcers, Ringing in the Ears, Seasonal Allergies, Sinus Pain, Sore Throat, Visual Disturbances and Yellow Eyes. Respiratory Not Present- Bloody sputum, Chronic Cough, Difficulty Breathing, Snoring and Wheezing. Cardiovascular Not Present- Chest Pain, Difficulty Breathing Lying Down, Leg Cramps, Palpitations, Rapid Heart Rate, Shortness of Breath and Swelling of Extremities. Gastrointestinal Not Present- Abdominal Pain, Bloating, Bloody Stool, Change in Bowel Habits, Chronic diarrhea, Constipation, Difficulty Swallowing, Excessive gas, Gets full quickly at meals, Hemorrhoids, Indigestion, Nausea, Rectal Pain and Vomiting. Female Genitourinary Present- Frequency. Not Present- Nocturia, Painful Urination, Pelvic Pain and Urgency. Musculoskeletal Not Present- Back Pain, Joint Pain, Joint Stiffness, Muscle Pain, Muscle Weakness and Swelling of Extremities. Neurological Not Present- Decreased Memory, Fainting, Headaches, Numbness, Seizures, Tingling, Tremor, Trouble walking and Weakness. Psychiatric Not Present- Anxiety, Bipolar, Change in Sleep Pattern, Depression, Fearful and Frequent crying. Endocrine Not Present- Cold Intolerance, Excessive Hunger, Hair Changes, Heat Intolerance, Hot flashes and New Diabetes. Hematology Not Present-  Blood Thinners, Easy Bruising, Excessive bleeding, Gland problems, HIV and Persistent Infections.  Vitals Caroline Bridges CMA; 04/08/2018 11:44 AM) 04/08/2018 11:43 AM Weight: 164.4 lb Height: 65in Body Surface Area: 1.82 m Body Mass Index: 27.36 kg/m  Temp.: 97.42F  Pulse: 89 (Regular)  BP: 114/74 (Sitting, Left Arm, Standard)      Physical Exam (Nate Perri A. Malayla Granberry Bridges; 04/08/2018 11:58 AM)  General Mental Status-Alert. General Appearance-Consistent with stated age. Hydration-Well hydrated. Voice-Normal.  Head and Neck Head-normocephalic, atraumatic with no lesions or palpable masses. Trachea-midline. Thyroid Gland Characteristics - normal size and consistency.  Eye Eyeball - Bilateral-Extraocular movements intact. Sclera/Conjunctiva - Bilateral-No scleral icterus.  Chest and Lung Exam Chest and lung exam reveals -quiet, even and easy respiratory effort with no use of accessory muscles and on auscultation, normal breath sounds, no adventitious sounds and normal vocal resonance. Inspection Chest Wall - Normal. Back - normal.  Breast Note: Bruising right breast upper quadrant without mass lesion. Left breast is normal.  Neurologic Neurologic evaluation reveals -alert and oriented x 3 with no impairment of recent or remote memory. Mental Status-Normal.  Lymphatic Head & Neck  General Head & Neck Lymphatics: Bilateral - Description - Normal. Axillary  General Axillary Region: Bilateral - Description - Normal. Tenderness - Non Tender.    Assessment & Plan (Danee Soller A. Wilmar Prabhakar Bridges; 04/08/2018 11:59 AM)  BREAST MASS, RIGHT (N63.10) Impression: Discussed options of lumpectomy versus observation. Risk of bleeding, infection, cosmetic deformity and potential need further operations. Discussed discordant pathology and radiology in the rationale for lumpectomy in this circumstance. Small but present risk of occult malignancy  discussed.     Risk of lumpectomy include bleeding, infection, seroma, more surgery, use of seed/wire, wound care, cosmetic deformity and the need for other treatments, death , blood clots, death. Pt agrees to proceed.  Current Plans Pt Education - [facility] Breast Biopsy HCI: discussed with patient and provided information.

## 2018-04-08 NOTE — H&P (Signed)
Caroline Bridges Documented: 04/08/2018 11:43 AM Location: [facility] Patient #: [id number] DOB: 1975-11-08 Divorced / Language: [name] / Race: White Female  History of Present Illness Caroline Fus A. Jarious Lyon MD; 04/08/2018 11:57 AM) Patient words: Patient sent at the request of Dr. Audie Box for abnormal mammogram. The patient underwent a screening mammogram which showed a density in her upper and lower quadrant of her right breast. Core biopsy was done which showed fibrocystic change but this was felt to be discordant by the radiologist. She states she had a maternal aunt with breast cancer but no one else in her family. The patient denies any history of breast pain, breast mass or nipple discharge.                        Diagnosis Breast, right, needle core biopsy, 9:30 o'clock - BENIGN BREAST PARENCHYMA WITH FIBROCYSTIC CHANGE. SEE NOTE. - NEGATIVE FOR CARCINOMA.Marland Kitchen           43 year old patient recalled from recent baseline screening mammogram for evaluation of a possible asymmetry in the right breast. This asymmetry was seen in the superior right breast on the MLO projection. It projects laterally on tomographic images.  EXAM: DIGITAL DIAGNOSTIC RIGHT MAMMOGRAM WITH TOMO  ULTRASOUND RIGHT BREAST  COMPARISON: Previous exam(s).  ACR Breast Density Category c: The breast tissue is heterogeneously dense, which may obscure small masses.  FINDINGS: Additional mammographic views performed today confirm possible mass with distortion in the posterior third of the upper-outer right breast.  On physical exam, the outer right breast is soft to palpation. I do not palpate a thickening or mass in the region of the findings on mammogram/ultrasound.  Targeted ultrasound is performed, showing an irregular hypoechoic mass with associated posterior acoustic shadowing. This mass is taller than wide and is at 9:30 position approximately 5 to 6 cm from the  nipple. This mass is best visualized with harmonics. The deep margin of the mass is difficult to delineate given the associated acoustical shadowing. The mass is estimated to be 0.8 x 1.6 x 0.9 cm.  Ultrasound of the right axilla is negative for lymphadenopathy.  IMPRESSION: Suspicious mass with associated distortion in the 9:30 position of the right breast. The mass is nonpalpable.  RECOMMENDATION: Ultrasound-guided core needle biopsy of the right breast is recommended and will be scheduled for the patient. The procedure for biopsy was discussed with the patient.  I have discussed the findings and recommendations with the patient. Results were also provided in writing at the conclusion of the visit. If applicable, a reminder letter will be sent to the patient regarding the next appointment.  BI-RADS CATEGORY 4: Suspicious.   Electronically Signed By: Britta Mccreedy M.D. On: 03/25/2018 13:16.  The patient is a 43 year old female.   Past Surgical History Caroline Bridges, CMA; 04/08/2018 11:43 AM) Breast Biopsy Right. Knee Surgery Left. Oral Surgery  Diagnostic Studies History Caroline Bridges, [place]; 04/08/2018 11:43 AM) Colonoscopy never Mammogram within last year  Allergies Caroline Bridges, CMA; 04/08/2018 11:45 AM) Biaxin *MACROLIDES* Codeine Phosphate *ANALGESICS - OPIOID* Morphine Derivatives Naproxen *ANALGESICS - ANTI-INFLAMMATORY* Allergies Reconciled  Medication History Caroline Bridges, CMA; 04/08/2018 11:45 AM) Medications Reconciled  Social History Caroline Bridges, CMA; 04/08/2018 11:43 AM) Alcohol use Occasional alcohol use. Caffeine use Carbonated beverages, Coffee. No drug use Tobacco use Current every day smoker.  Family History Caroline Bridges, [place]; 04/08/2018 11:43 AM) Arthritis Father, Mother, Son. Colon Polyps Father, Mother. Depression Mother. Diabetes Mellitus Father. Thyroid problems  Mother.  Pregnancy / Birth  History (Caroline Bridges, CMA; 04/08/2018 11:43 AM) Age at menarche 12 years. Contraceptive History Depo-provera. Gravida 3 Length (months) of breastfeeding 3-6 Maternal age 15-20 Para 3 Regular periods  Other Problems (Caroline Bridges, CMA; 04/08/2018 11:43 AM) Kidney Stone     Review of Systems (Caroline Bridges CMA; 04/08/2018 11:43 AM) General Not Present- Appetite Loss, Chills, Fatigue, Fever, Night Sweats, Weight Gain and Weight Loss. Skin Not Present- Change in Wart/Mole, Dryness, Hives, Jaundice, New Lesions, Non-Healing Wounds, Rash and Ulcer. HEENT Present- Wears glasses/contact lenses. Not Present- Earache, Hearing Loss, Hoarseness, Nose Bleed, Oral Ulcers, Ringing in the Ears, Seasonal Allergies, Sinus Pain, Sore Throat, Visual Disturbances and Yellow Eyes. Respiratory Not Present- Bloody sputum, Chronic Cough, Difficulty Breathing, Snoring and Wheezing. Cardiovascular Not Present- Chest Pain, Difficulty Breathing Lying Down, Leg Cramps, Palpitations, Rapid Heart Rate, Shortness of Breath and Swelling of Extremities. Gastrointestinal Not Present- Abdominal Pain, Bloating, Bloody Stool, Change in Bowel Habits, Chronic diarrhea, Constipation, Difficulty Swallowing, Excessive gas, Gets full quickly at meals, Hemorrhoids, Indigestion, Nausea, Rectal Pain and Vomiting. Female Genitourinary Present- Frequency. Not Present- Nocturia, Painful Urination, Pelvic Pain and Urgency. Musculoskeletal Not Present- Back Pain, Joint Pain, Joint Stiffness, Muscle Pain, Muscle Weakness and Swelling of Extremities. Neurological Not Present- Decreased Memory, Fainting, Headaches, Numbness, Seizures, Tingling, Tremor, Trouble walking and Weakness. Psychiatric Not Present- Anxiety, Bipolar, Change in Sleep Pattern, Depression, Fearful and Frequent crying. Endocrine Not Present- Cold Intolerance, Excessive Hunger, Hair Changes, Heat Intolerance, Hot flashes and New Diabetes. Hematology Not Present-  Blood Thinners, Easy Bruising, Excessive bleeding, Gland problems, HIV and Persistent Infections.  Vitals (Caroline Bridges CMA; 04/08/2018 11:44 AM) 04/08/2018 11:43 AM Weight: 164.4 lb Height: 65in Body Surface Area: 1.82 m Body Mass Index: 27.36 kg/m  Temp.: 97.9F  Pulse: 89 (Regular)  BP: 114/74 (Sitting, Left Arm, Standard)      Physical Exam (King Pinzon A. Abaigeal Moomaw MD; 04/08/2018 11:58 AM)  General Mental Status-Alert. General Appearance-Consistent with stated age. Hydration-Well hydrated. Voice-Normal.  Head and Neck Head-normocephalic, atraumatic with no lesions or palpable masses. Trachea-midline. Thyroid Gland Characteristics - normal size and consistency.  Eye Eyeball - Bilateral-Extraocular movements intact. Sclera/Conjunctiva - Bilateral-No scleral icterus.  Chest and Lung Exam Chest and lung exam reveals -quiet, even and easy respiratory effort with no use of accessory muscles and on auscultation, normal breath sounds, no adventitious sounds and normal vocal resonance. Inspection Chest Wall - Normal. Back - normal.  Breast Note: Bruising right breast upper quadrant without mass lesion. Left breast is normal.  Neurologic Neurologic evaluation reveals -alert and oriented x 3 with no impairment of recent or remote memory. Mental Status-Normal.  Lymphatic Head & Neck  General Head & Neck Lymphatics: Bilateral - Description - Normal. Axillary  General Axillary Region: Bilateral - Description - Normal. Tenderness - Non Tender.    Assessment & Plan (Fayne Mcguffee A. Cloa Bushong MD; 04/08/2018 11:59 AM)  BREAST MASS, RIGHT (N63.10) Impression: Discussed options of lumpectomy versus observation. Risk of bleeding, infection, cosmetic deformity and potential need further operations. Discussed discordant pathology and radiology in the rationale for lumpectomy in this circumstance. Small but present risk of occult malignancy  discussed.     Risk of lumpectomy include bleeding, infection, seroma, more surgery, use of seed/wire, wound care, cosmetic deformity and the need for other treatments, death , blood clots, death. Pt agrees to proceed.  Current Plans Pt Education - [facility] Breast Biopsy HCI: discussed with patient and provided information. 

## 2018-04-09 ENCOUNTER — Other Ambulatory Visit: Payer: Self-pay

## 2018-04-09 ENCOUNTER — Encounter (HOSPITAL_BASED_OUTPATIENT_CLINIC_OR_DEPARTMENT_OTHER): Payer: Self-pay | Admitting: *Deleted

## 2018-04-30 ENCOUNTER — Ambulatory Visit
Admission: RE | Admit: 2018-04-30 | Discharge: 2018-04-30 | Disposition: A | Discharge status: Home / Self Care | Payer: BLUE CROSS/BLUE SHIELD | Source: Ambulatory Visit | Attending: Surgery | Admitting: Surgery

## 2018-04-30 DIAGNOSIS — R928 Other abnormal and inconclusive findings on diagnostic imaging of breast: Secondary | ICD-10-CM | POA: Diagnosis not present

## 2018-04-30 DIAGNOSIS — N6310 Unspecified lump in the right breast, unspecified quadrant: Secondary | ICD-10-CM

## 2018-04-30 NOTE — Progress Notes (Signed)
Ensure Pre-Surgery drink given to patient with instructions to complete by 0645 DOS.  Surgical scrub also given to patient with instructions for use.  Patient verbalized understanding of instructions.

## 2018-05-01 NOTE — Anesthesia Preprocedure Evaluation (Addendum)
Anesthesia Evaluation  Patient identified by MRN, date of birth, ID band Patient awake    Reviewed: Allergy & Precautions, NPO status , Patient's Chart, lab work & pertinent test results  History of Anesthesia Complications (+) PONV  Airway Mallampati: I  TM Distance: >3 FB Neck ROM: Full    Dental  (+) Dental Advisory Given, Chipped,    Pulmonary Current Smoker,    Pulmonary exam normal breath sounds clear to auscultation       Cardiovascular negative cardio ROS Normal cardiovascular exam Rhythm:Regular Rate:Normal     Neuro/Psych negative neurological ROS     GI/Hepatic negative GI ROS, Neg liver ROS,   Endo/Other  negative endocrine ROS  Renal/GU negative Renal ROS     Musculoskeletal negative musculoskeletal ROS (+)   Abdominal   Peds  Hematology negative hematology ROS (+)   Anesthesia Other Findings Day of surgery medications reviewed with the patient.  Right breast mass  Reproductive/Obstetrics                            Anesthesia Physical Anesthesia Plan  ASA: II  Anesthesia Plan: General   Post-op Pain Management:    Induction: Intravenous  PONV Risk Score and Plan: 3 and Treatment may vary due to age or medical condition, Ondansetron, Dexamethasone and Midazolam  Airway Management Planned: LMA  Additional Equipment: None  Intra-op Plan:   Post-operative Plan: Extubation in OR  Informed Consent: I have reviewed the patients History and Physical, chart, labs and discussed the procedure including the risks, benefits and alternatives for the proposed anesthesia with the patient or authorized representative who has indicated his/her understanding and acceptance.     Dental advisory given  Plan Discussed with: CRNA  Anesthesia Plan Comments:        Anesthesia Quick Evaluation

## 2018-05-02 ENCOUNTER — Ambulatory Visit (HOSPITAL_BASED_OUTPATIENT_CLINIC_OR_DEPARTMENT_OTHER)
Admission: RE | Admit: 2018-05-02 | Discharge: 2018-05-02 | Disposition: A | Discharge status: Home / Self Care | Payer: BLUE CROSS/BLUE SHIELD | Attending: Surgery | Admitting: Surgery

## 2018-05-02 ENCOUNTER — Encounter (HOSPITAL_BASED_OUTPATIENT_CLINIC_OR_DEPARTMENT_OTHER): Payer: Self-pay | Admitting: *Deleted

## 2018-05-02 ENCOUNTER — Ambulatory Visit (HOSPITAL_BASED_OUTPATIENT_CLINIC_OR_DEPARTMENT_OTHER): Payer: BLUE CROSS/BLUE SHIELD | Admitting: Anesthesiology

## 2018-05-02 ENCOUNTER — Other Ambulatory Visit: Payer: Self-pay

## 2018-05-02 ENCOUNTER — Ambulatory Visit
Admission: RE | Admit: 2018-05-02 | Discharge: 2018-05-02 | Disposition: A | Discharge status: Home / Self Care | Payer: BLUE CROSS/BLUE SHIELD | Source: Ambulatory Visit | Attending: Surgery | Admitting: Surgery

## 2018-05-02 ENCOUNTER — Encounter (HOSPITAL_BASED_OUTPATIENT_CLINIC_OR_DEPARTMENT_OTHER)
Admission: RE | Disposition: A | Discharge status: Home / Self Care | Payer: Self-pay | Source: Home / Self Care | Attending: Surgery

## 2018-05-02 DIAGNOSIS — N6310 Unspecified lump in the right breast, unspecified quadrant: Secondary | ICD-10-CM | POA: Diagnosis present

## 2018-05-02 DIAGNOSIS — R921 Mammographic calcification found on diagnostic imaging of breast: Secondary | ICD-10-CM | POA: Diagnosis not present

## 2018-05-02 DIAGNOSIS — N6311 Unspecified lump in the right breast, upper outer quadrant: Secondary | ICD-10-CM | POA: Diagnosis not present

## 2018-05-02 DIAGNOSIS — N6489 Other specified disorders of breast: Secondary | ICD-10-CM | POA: Insufficient documentation

## 2018-05-02 DIAGNOSIS — F17200 Nicotine dependence, unspecified, uncomplicated: Secondary | ICD-10-CM | POA: Insufficient documentation

## 2018-05-02 DIAGNOSIS — N6011 Diffuse cystic mastopathy of right breast: Secondary | ICD-10-CM | POA: Diagnosis not present

## 2018-05-02 DIAGNOSIS — R928 Other abnormal and inconclusive findings on diagnostic imaging of breast: Secondary | ICD-10-CM | POA: Diagnosis not present

## 2018-05-02 HISTORY — DX: Adverse effect of unspecified anesthetic, initial encounter: T41.45XA

## 2018-05-02 HISTORY — PX: BREAST EXCISIONAL BIOPSY: SUR124

## 2018-05-02 HISTORY — DX: Other specified postprocedural states: Z98.890

## 2018-05-02 HISTORY — PX: BREAST LUMPECTOMY WITH RADIOACTIVE SEED LOCALIZATION: SHX6424

## 2018-05-02 HISTORY — DX: Other complications of anesthesia, initial encounter: T88.59XA

## 2018-05-02 HISTORY — DX: Unspecified lump in the right breast, unspecified quadrant: N63.10

## 2018-05-02 HISTORY — DX: Personal history of urinary calculi: Z87.442

## 2018-05-02 HISTORY — DX: Other specified postprocedural states: R11.2

## 2018-05-02 LAB — POCT PREGNANCY, URINE: Preg Test, Ur: NEGATIVE

## 2018-05-02 SURGERY — BREAST LUMPECTOMY WITH RADIOACTIVE SEED LOCALIZATION
Anesthesia: General | Site: Breast | Laterality: Right

## 2018-05-02 MED ORDER — FENTANYL CITRATE (PF) 100 MCG/2ML IJ SOLN: 25.0000 ug | INTRAMUSCULAR | Status: DC | PRN

## 2018-05-02 MED ORDER — IBUPROFEN 800 MG PO TABS: 800.0000 mg | ORAL_TABLET | Freq: Three times a day (TID) | ORAL | 0 refills | Status: DC | PRN

## 2018-05-02 MED ORDER — PROPOFOL 10 MG/ML IV BOLUS
INTRAVENOUS | Status: AC
  Filled 2018-05-02: qty 20

## 2018-05-02 MED ORDER — CHLORHEXIDINE GLUCONATE CLOTH 2 % EX PADS: 6.0000 | MEDICATED_PAD | Freq: Once | CUTANEOUS | Status: DC

## 2018-05-02 MED ORDER — GABAPENTIN 300 MG PO CAPS
300.0000 mg | ORAL_CAPSULE | ORAL | Status: AC
  Administered 2018-05-02: 300 mg via ORAL

## 2018-05-02 MED ORDER — SCOPOLAMINE 1 MG/3DAYS TD PT72: 1.0000 | MEDICATED_PATCH | Freq: Once | TRANSDERMAL | Status: DC | PRN

## 2018-05-02 MED ORDER — ONDANSETRON HCL 4 MG/2ML IJ SOLN
INTRAMUSCULAR | Status: AC
  Filled 2018-05-02: qty 2

## 2018-05-02 MED ORDER — FENTANYL CITRATE (PF) 100 MCG/2ML IJ SOLN
INTRAMUSCULAR | Status: AC
  Filled 2018-05-02: qty 2

## 2018-05-02 MED ORDER — BUPIVACAINE-EPINEPHRINE (PF) 0.25% -1:200000 IJ SOLN
INTRAMUSCULAR | Status: DC | PRN
  Administered 2018-05-02: 20 mL

## 2018-05-02 MED ORDER — PROMETHAZINE HCL 25 MG/ML IJ SOLN: 6.2500 mg | INTRAMUSCULAR | Status: DC | PRN

## 2018-05-02 MED ORDER — GABAPENTIN 300 MG PO CAPS
ORAL_CAPSULE | ORAL | Status: AC
  Filled 2018-05-02: qty 1

## 2018-05-02 MED ORDER — ONDANSETRON HCL 4 MG/2ML IJ SOLN
INTRAMUSCULAR | Status: DC | PRN
  Administered 2018-05-02: 4 mg via INTRAVENOUS

## 2018-05-02 MED ORDER — LACTATED RINGERS IV SOLN
INTRAVENOUS | Status: DC
  Administered 2018-05-02: 10:00:00 via INTRAVENOUS

## 2018-05-02 MED ORDER — ACETAMINOPHEN 10 MG/ML IV SOLN: 1000.0000 mg | Freq: Once | INTRAVENOUS | Status: DC | PRN

## 2018-05-02 MED ORDER — LIDOCAINE 2% (20 MG/ML) 5 ML SYRINGE
INTRAMUSCULAR | Status: AC
  Filled 2018-05-02: qty 5

## 2018-05-02 MED ORDER — CEFAZOLIN SODIUM-DEXTROSE 2-4 GM/100ML-% IV SOLN
INTRAVENOUS | Status: AC
  Filled 2018-05-02: qty 100

## 2018-05-02 MED ORDER — MIDAZOLAM HCL 2 MG/2ML IJ SOLN
INTRAMUSCULAR | Status: AC
  Filled 2018-05-02: qty 2

## 2018-05-02 MED ORDER — FENTANYL CITRATE (PF) 100 MCG/2ML IJ SOLN
50.0000 ug | INTRAMUSCULAR | Status: AC | PRN
  Administered 2018-05-02: 25 ug via INTRAVENOUS
  Administered 2018-05-02: 50 ug via INTRAVENOUS
  Administered 2018-05-02: 25 ug via INTRAVENOUS

## 2018-05-02 MED ORDER — HYDROCODONE-ACETAMINOPHEN 5-325 MG PO TABS: 1.0000 | ORAL_TABLET | Freq: Four times a day (QID) | ORAL | 0 refills | Status: DC | PRN

## 2018-05-02 MED ORDER — DEXAMETHASONE SODIUM PHOSPHATE 4 MG/ML IJ SOLN
INTRAMUSCULAR | Status: DC | PRN
  Administered 2018-05-02: 4 mg via INTRAVENOUS

## 2018-05-02 MED ORDER — ACETAMINOPHEN 500 MG PO TABS
1000.0000 mg | ORAL_TABLET | ORAL | Status: AC
  Administered 2018-05-02: 1000 mg via ORAL

## 2018-05-02 MED ORDER — ACETAMINOPHEN 500 MG PO TABS
ORAL_TABLET | ORAL | Status: AC
  Filled 2018-05-02: qty 2

## 2018-05-02 MED ORDER — MIDAZOLAM HCL 2 MG/2ML IJ SOLN
1.0000 mg | INTRAMUSCULAR | Status: DC | PRN
  Administered 2018-05-02: 2 mg via INTRAVENOUS

## 2018-05-02 MED ORDER — CEFAZOLIN SODIUM-DEXTROSE 2-4 GM/100ML-% IV SOLN
2.0000 g | INTRAVENOUS | Status: AC
  Administered 2018-05-02: 2 g via INTRAVENOUS

## 2018-05-02 MED ORDER — DEXAMETHASONE SODIUM PHOSPHATE 10 MG/ML IJ SOLN
INTRAMUSCULAR | Status: AC
  Filled 2018-05-02: qty 1

## 2018-05-02 SURGICAL SUPPLY — 52 items
ADH SKN CLS APL DERMABOND .7 (GAUZE/BANDAGES/DRESSINGS) ×1
APPLIER CLIP 9.375 MED OPEN (MISCELLANEOUS)
APR CLP MED 9.3 20 MLT OPN (MISCELLANEOUS)
BINDER BREAST LRG (GAUZE/BANDAGES/DRESSINGS) IMPLANT
BINDER BREAST MEDIUM (GAUZE/BANDAGES/DRESSINGS) IMPLANT
BINDER BREAST XLRG (GAUZE/BANDAGES/DRESSINGS) IMPLANT
BINDER BREAST XXLRG (GAUZE/BANDAGES/DRESSINGS) IMPLANT
BLADE SURG 15 STRL LF DISP TIS (BLADE) ×1 IMPLANT
BLADE SURG 15 STRL SS (BLADE) ×2
CANISTER SUC SOCK COL 7IN (MISCELLANEOUS) IMPLANT
CANISTER SUCT 1200ML W/VALVE (MISCELLANEOUS) IMPLANT
CHLORAPREP W/TINT 26ML (MISCELLANEOUS) ×2 IMPLANT
CLIP APPLIE 9.375 MED OPEN (MISCELLANEOUS) IMPLANT
COVER BACK TABLE 60X90IN (DRAPES) ×2 IMPLANT
COVER MAYO STAND STRL (DRAPES) ×2 IMPLANT
COVER PROBE W GEL 5X96 (DRAPES) ×2 IMPLANT
COVER WAND RF STERILE (DRAPES) IMPLANT
DECANTER SPIKE VIAL GLASS SM (MISCELLANEOUS) IMPLANT
DERMABOND ADVANCED (GAUZE/BANDAGES/DRESSINGS) ×1
DERMABOND ADVANCED .7 DNX12 (GAUZE/BANDAGES/DRESSINGS) ×1 IMPLANT
DRAPE LAPAROSCOPIC ABDOMINAL (DRAPES) IMPLANT
DRAPE LAPAROTOMY 100X72 PEDS (DRAPES) ×2 IMPLANT
DRAPE UTILITY XL STRL (DRAPES) ×2 IMPLANT
ELECT COATED BLADE 2.86 ST (ELECTRODE) ×2 IMPLANT
ELECT REM PT RETURN 9FT ADLT (ELECTROSURGICAL) ×2
ELECTRODE REM PT RTRN 9FT ADLT (ELECTROSURGICAL) ×1 IMPLANT
GLOVE BIO SURGEON STRL SZ 6.5 (GLOVE) ×1 IMPLANT
GLOVE BIOGEL PI IND STRL 7.0 (GLOVE) IMPLANT
GLOVE BIOGEL PI IND STRL 8 (GLOVE) ×1 IMPLANT
GLOVE BIOGEL PI INDICATOR 7.0 (GLOVE) ×1
GLOVE BIOGEL PI INDICATOR 8 (GLOVE) ×1
GLOVE ECLIPSE 8.0 STRL XLNG CF (GLOVE) ×2 IMPLANT
GOWN STRL REUS W/ TWL LRG LVL3 (GOWN DISPOSABLE) ×2 IMPLANT
GOWN STRL REUS W/TWL LRG LVL3 (GOWN DISPOSABLE) ×4
HEMOSTAT ARISTA ABSORB 3G PWDR (HEMOSTASIS) IMPLANT
HEMOSTAT SNOW SURGICEL 2X4 (HEMOSTASIS) IMPLANT
KIT MARKER MARGIN INK (KITS) ×2 IMPLANT
NDL HYPO 25X1 1.5 SAFETY (NEEDLE) ×1 IMPLANT
NEEDLE HYPO 25X1 1.5 SAFETY (NEEDLE) ×2 IMPLANT
NS IRRIG 1000ML POUR BTL (IV SOLUTION) ×2 IMPLANT
PACK BASIN DAY SURGERY FS (CUSTOM PROCEDURE TRAY) ×2 IMPLANT
PENCIL BUTTON HOLSTER BLD 10FT (ELECTRODE) ×2 IMPLANT
SLEEVE SCD COMPRESS KNEE MED (MISCELLANEOUS) ×2 IMPLANT
SPONGE LAP 4X18 RFD (DISPOSABLE) ×2 IMPLANT
SUT MNCRL AB 4-0 PS2 18 (SUTURE) ×2 IMPLANT
SUT SILK 2 0 SH (SUTURE) IMPLANT
SUT VICRYL 3-0 CR8 SH (SUTURE) ×2 IMPLANT
SYR CONTROL 10ML LL (SYRINGE) ×2 IMPLANT
TOWEL GREEN STERILE FF (TOWEL DISPOSABLE) ×2 IMPLANT
TRAY FAXITRON CT DISP (TRAY / TRAY PROCEDURE) ×2 IMPLANT
TUBE CONNECTING 20X1/4 (TUBING) IMPLANT
YANKAUER SUCT BULB TIP NO VENT (SUCTIONS) IMPLANT

## 2018-05-02 NOTE — Op Note (Signed)
Preoperative diagnosis: Right breast mass  Postoperative diagnosis: Same  Procedure: Right breast seed localized lumpectomy  Surgeon: Thomas Cornett, MD  Anesthesia: General with local consisting of 0.25% Sensorcaine with epinephrine  EBL: 10 cc  Specimen: Right breast tissue with seed and clip verified by Faxitron  Drains: None  IV fluids: Per record  Indications for procedure: The patient is a 42-year-old female who underwent screening mammogram was found to have an area of abnormality in the right breast upper outer quadrant.  Core biopsy was done which showed fibrocystic change but this was felt to be discordant with the imaging.  She was sent for consultation and consideration of right breast lumpectomy to exclude malignancy.  We discussed situation with the patient and pros and cons of lumpectomy as well as long-term observation versus short-term observation and potential complications of the procedure.The procedure has been discussed with the patient. Alternatives to surgery have been discussed with the patient.  Risks of surgery include bleeding,  Infection,  Seroma formation, death,  and the need for further surgery.   The patient understands and wishes to proceed.  Description of procedure: The patient was met in the holding area.  The neoprobe was used to localize the seed.  Questions were answered about the procedure and the right side was marked as correct.  She was taken back to the operating room.  She was placed supine upon the operating room table.  After induction of general anesthesia, right breast was prepped and draped in sterile fashion and timeout was done.  She received appropriate preoperative antibiotics.  Neoprobe was used to localize the seed in the right breast upper outer quadrant.  Curvilinear incision was made in this area.  Dissection was carried down all tissue around the seed and clip were excised with a grossly negative margin.  Faxitron image revealed the seed  and clip to be in the specimen.  This was sent to pathology.  The cavity was made hemostatic.  Local anesthetic was infiltrated.  We then closed the cavity 3-0 Vicryl and 4-0 Monocryl.  Dermabond applied.  All final counts found to be correct.  The patient was awoke extubated taken to recovery in satisfactory condition. 

## 2018-05-02 NOTE — Transfer of Care (Signed)
Immediate Anesthesia Transfer of Care Note  Patient: Scientist, research (life sciences)  Procedure(s) Performed: RIGHT BREAST LUMPECTOMY WITH RADIOACTIVE SEED LOCALIZATION (Right Breast)  Patient Location: PACU  Anesthesia Type:General  Level of Consciousness: sedated  Airway & Oxygen Therapy: Patient Spontanous Breathing and Patient connected to face mask oxygen  Post-op Assessment: Report given to RN and Post -op Vital signs reviewed and stable  Post vital signs: Reviewed and stable  Last Vitals:  Vitals Value Taken Time  BP 113/60 05/02/2018 10:42 AM  Temp    Pulse 69 05/02/2018 10:44 AM  Resp 19 05/02/2018 10:44 AM  SpO2 100 % 05/02/2018 10:44 AM  Vitals shown include unvalidated device data.  Last Pain:  Vitals:   05/02/18 0908  TempSrc: Oral  PainSc: 1       Patients Stated Pain Goal: 3 (05/02/18 0908)  Complications: No apparent anesthesia complications

## 2018-05-02 NOTE — Interval H&P Note (Signed)
History and Physical Interval Note:  05/02/2018 9:32 AM  Caroline Bridges  has presented today for surgery, with the diagnosis of right breast mass  The various methods of treatment have been discussed with the patient and family. After consideration of risks, benefits and other options for treatment, the patient has consented to  Procedure(s): RIGHT BREAST LUMPECTOMY WITH RADIOACTIVE SEED LOCALIZATION (Right) as a surgical intervention .  The patient's history has been reviewed, patient examined, no change in status, stable for surgery.  I have reviewed the patient's chart and labs.  Questions were answered to the patient's satisfaction.     Roselynn Whitacre A Loda Bialas

## 2018-05-02 NOTE — Discharge Instructions (Signed)
NO TYLENOL BEFORE 2:30 pm!!        [facility] Office Phone Number [phone]  BREAST BIOPSY/ PARTIAL MASTECTOMY: POST OP INSTRUCTIONS  Always review your discharge instruction sheet given to you by the facility where your surgery was performed.  IF YOU HAVE DISABILITY OR FAMILY LEAVE FORMS, YOU MUST BRING THEM TO THE OFFICE FOR PROCESSING.  DO NOT GIVE THEM TO YOUR DOCTOR.  1. A prescription for pain medication may be given to you upon discharge.  Take your pain medication as prescribed, if needed.  If narcotic pain medicine is not needed, then you may take acetaminophen (Tylenol) or ibuprofen (Advil) as needed. 2. Take your usually prescribed medications unless otherwise directed 3. If you need a refill on your pain medication, please contact your pharmacy.  They will contact our office to request authorization.  Prescriptions will not be filled after 5pm or on week-ends. 4. You should eat very light the first 24 hours after surgery, such as soup, crackers, pudding, etc.  Resume your normal diet the day after surgery. 5. Most patients will experience some swelling and bruising in the breast.  Ice packs and a good support bra will help.  Swelling and bruising can take several days to resolve.  6. It is common to experience some constipation if taking pain medication after surgery.  Increasing fluid intake and taking a stool softener will usually help or prevent this problem from occurring.  A mild laxative (Milk of Magnesia or Miralax) should be taken according to package directions if there are no bowel movements after 48 hours. 7. Unless discharge instructions indicate otherwise, you may remove your bandages 24-48 hours after surgery, and you may shower at that time.  You may have steri-strips (small skin tapes) in place directly over the incision.  These strips should be left on the skin for 7-10 days.  If your surgeon used skin glue on the incision, you may shower in 24  hours.  The glue will flake off over the next 2-3 weeks.  Any sutures or staples will be removed at the office during your follow-up visit. 8. ACTIVITIES:  You may resume regular daily activities (gradually increasing) beginning the next day.  Wearing a good support bra or sports bra minimizes pain and swelling.  You may have sexual intercourse when it is comfortable. a. You may drive when you no longer are taking prescription pain medication, you can comfortably wear a seatbelt, and you can safely maneuver your car and apply brakes. b. RETURN TO WORK:  ______________________________________________________________________________________ 9. You should see your doctor in the office for a follow-up appointment approximately two weeks after your surgery.  Your doctors nurse will typically make your follow-up appointment when she calls you with your pathology report.  Expect your pathology report 2-3 business days after your surgery.  You may call to check if you do not hear from [place] after three days. 10. OTHER INSTRUCTIONS: _______________________________________________________________________________________________ _____________________________________________________________________________________________________________________________________ _____________________________________________________________________________________________________________________________________ _____________________________________________________________________________________________________________________________________  WHEN TO CALL YOUR DOCTOR: 1. Fever over 101.0 2. Nausea and/or vomiting. 3. Extreme swelling or bruising. 4. Continued bleeding from incision. 5. Increased pain, redness, or drainage from the incision.  The clinic staff is available to answer your questions during regular business hours.  Please dont hesitate to call and ask to speak to one of the nurses for clinical concerns.  If you have a  medical emergency, go to the nearest emergency room or call 911.  A surgeon from [facility] is always on  call at the hospital.  For further questions, please visit [email or web address]

## 2018-05-02 NOTE — Anesthesia Postprocedure Evaluation (Signed)
Anesthesia Post Note  Patient: Scientist, research (life sciences)  Procedure(s) Performed: RIGHT BREAST LUMPECTOMY WITH RADIOACTIVE SEED LOCALIZATION (Right Breast)     Patient location during evaluation: PACU Anesthesia Type: General Level of consciousness: awake and alert Pain management: pain level controlled Vital Signs Assessment: post-procedure vital signs reviewed and stable Respiratory status: spontaneous breathing, nonlabored ventilation and respiratory function stable Cardiovascular status: blood pressure returned to baseline and stable Postop Assessment: no apparent nausea or vomiting Anesthetic complications: no    Last Vitals:  Vitals:   05/02/18 1045 05/02/18 1100  BP: 106/62 103/65  Pulse: 65 79  Resp: 19 17  Temp:    SpO2: 100% 100%    Last Pain:  Vitals:   05/02/18 1100  TempSrc:   PainSc: 3                  Kaylyn Layer

## 2018-05-02 NOTE — Anesthesia Procedure Notes (Signed)
Procedure Name: LMA Insertion Date/Time: 05/02/2018 10:00 AM Performed by: Burna Cash, CRNA Pre-anesthesia Checklist: Patient identified, Emergency Drugs available, Suction available and Patient being monitored Patient Re-evaluated:Patient Re-evaluated prior to induction Oxygen Delivery Method: Circle system utilized Preoxygenation: Pre-oxygenation with 100% oxygen Induction Type: IV induction Ventilation: Mask ventilation without difficulty LMA: LMA inserted LMA Size: 4.0 Number of attempts: 1 Airway Equipment and Method: Bite block Placement Confirmation: positive ETCO2 Tube secured with: Tape Dental Injury: Teeth and Oropharynx as per pre-operative assessment

## 2018-05-03 ENCOUNTER — Encounter (HOSPITAL_BASED_OUTPATIENT_CLINIC_OR_DEPARTMENT_OTHER): Payer: Self-pay | Admitting: Surgery

## 2018-11-19 ENCOUNTER — Encounter: Payer: Self-pay | Admitting: Gynecology

## 2018-12-20 ENCOUNTER — Other Ambulatory Visit: Payer: Self-pay

## 2018-12-23 ENCOUNTER — Ambulatory Visit (INDEPENDENT_AMBULATORY_CARE_PROVIDER_SITE_OTHER): Payer: BC Managed Care – PPO | Admitting: Gynecology

## 2018-12-23 ENCOUNTER — Other Ambulatory Visit: Payer: Self-pay

## 2018-12-23 ENCOUNTER — Encounter: Payer: Self-pay | Admitting: Gynecology

## 2018-12-23 VITALS — BP 118/76 | Ht 63.0 in | Wt 166.0 lb

## 2018-12-23 DIAGNOSIS — Z01419 Encounter for gynecological examination (general) (routine) without abnormal findings: Secondary | ICD-10-CM

## 2018-12-23 NOTE — Progress Notes (Signed)
    Caroline Bridges 1976/01/15 [id number]        43 y.o.  G3P3003 for annual gynecologic exam.  Without gynecologic complaints  Past medical history,surgical history, problem list, medications, allergies, family history and social history were all reviewed and documented as reviewed in the EPIC chart.  ROS:  Performed with pertinent positives and negatives included in the history, assessment and plan.   Additional significant findings : None   Exam: Caryn Bee assistant Vitals:   12/23/18 0755  BP: 118/76  Weight: 166 lb (75.3 kg)  Height: 5\' 3"  (1.6 m)   Body mass index is 29.41 kg/m.  General appearance:  Normal affect, orientation and appearance. Skin: Grossly normal HEENT: Without gross lesions.  No cervical or supraclavicular adenopathy. Thyroid normal.  Lungs:  Clear without wheezing, rales or rhonchi Cardiac: RR, without RMG Abdominal:  Soft, nontender, without masses, guarding, rebound, organomegaly or hernia Breasts:  Examined lying and sitting without masses, retractions, discharge or axillary adenopathy. Pelvic:  Ext, BUS, Vagina: Normal  Cervix: Normal with mild LEEP scarring  Uterus: Anteverted, normal size, shape and contour, midline and mobile nontender   Adnexa: Without masses or tenderness    Anus and perineum: Normal   Rectovaginal: Normal sphincter tone without palpated masses or tenderness.    Assessment/Plan:  43 y.o. G62P3003 female for annual gynecologic exam.  With regular menses, tubal sterilization  1. Pap smear/HPV 2018.  No Pap smear done today.  History of cryosurgery 2000 and LEEP 2009 with normal Pap smears since.  Plan repeat Pap smear/HPV at 5-year interval per current screening guidelines. 2. Mammography 02/2018.  Continue with annual mammography this coming year.  Breast exam normal today. 3. Health maintenance.  No routine lab work done as patient does this elsewhere.  Follow-up 1 year, sooner as needed.   Anastasio Auerbach MD,  8:17 AM 12/23/2018

## 2018-12-23 NOTE — Patient Instructions (Signed)
Follow-up in 1 year for annual exam, sooner if any issues. 

## 2018-12-24 ENCOUNTER — Other Ambulatory Visit: Payer: Self-pay | Admitting: Gynecology

## 2018-12-24 DIAGNOSIS — Z1231 Encounter for screening mammogram for malignant neoplasm of breast: Secondary | ICD-10-CM

## 2019-03-18 ENCOUNTER — Other Ambulatory Visit: Payer: Self-pay

## 2019-03-18 ENCOUNTER — Ambulatory Visit
Admission: RE | Admit: 2019-03-18 | Discharge: 2019-03-18 | Disposition: A | Discharge status: Home / Self Care | Payer: BLUE CROSS/BLUE SHIELD | Source: Ambulatory Visit | Attending: Gynecology | Admitting: Gynecology

## 2019-03-18 ENCOUNTER — Other Ambulatory Visit: Payer: Self-pay | Admitting: Obstetrics and Gynecology

## 2019-03-18 DIAGNOSIS — Z1231 Encounter for screening mammogram for malignant neoplasm of breast: Secondary | ICD-10-CM | POA: Diagnosis not present

## 2019-03-20 ENCOUNTER — Other Ambulatory Visit: Payer: Self-pay | Admitting: Obstetrics & Gynecology

## 2019-03-20 DIAGNOSIS — R928 Other abnormal and inconclusive findings on diagnostic imaging of breast: Secondary | ICD-10-CM

## 2019-03-31 ENCOUNTER — Other Ambulatory Visit: Payer: Self-pay

## 2019-03-31 ENCOUNTER — Ambulatory Visit: Payer: BC Managed Care – PPO

## 2019-03-31 ENCOUNTER — Ambulatory Visit
Admission: RE | Admit: 2019-03-31 | Discharge: 2019-03-31 | Disposition: A | Discharge status: Home / Self Care | Payer: BC Managed Care – PPO | Source: Ambulatory Visit | Attending: Obstetrics & Gynecology | Admitting: Obstetrics & Gynecology

## 2019-03-31 DIAGNOSIS — R928 Other abnormal and inconclusive findings on diagnostic imaging of breast: Secondary | ICD-10-CM | POA: Diagnosis not present

## 2019-04-16 DIAGNOSIS — H5213 Myopia, bilateral: Secondary | ICD-10-CM | POA: Diagnosis not present

## 2019-06-01 IMAGING — DX BREAST SURGICAL SPECIMEN
1 series · 2 of 2 positions shown · non-contrast
Comparison: Previous exam(s).

CLINICAL DATA: Evaluate surgical specimen following RIGHT breast
excision.

EXAM:
SPECIMEN RADIOGRAPH OF THE RIGHT BREAST

[Series 2: specimen digital x-ray, derived · right · 2 of 2 slices shown]
[im 1/2]
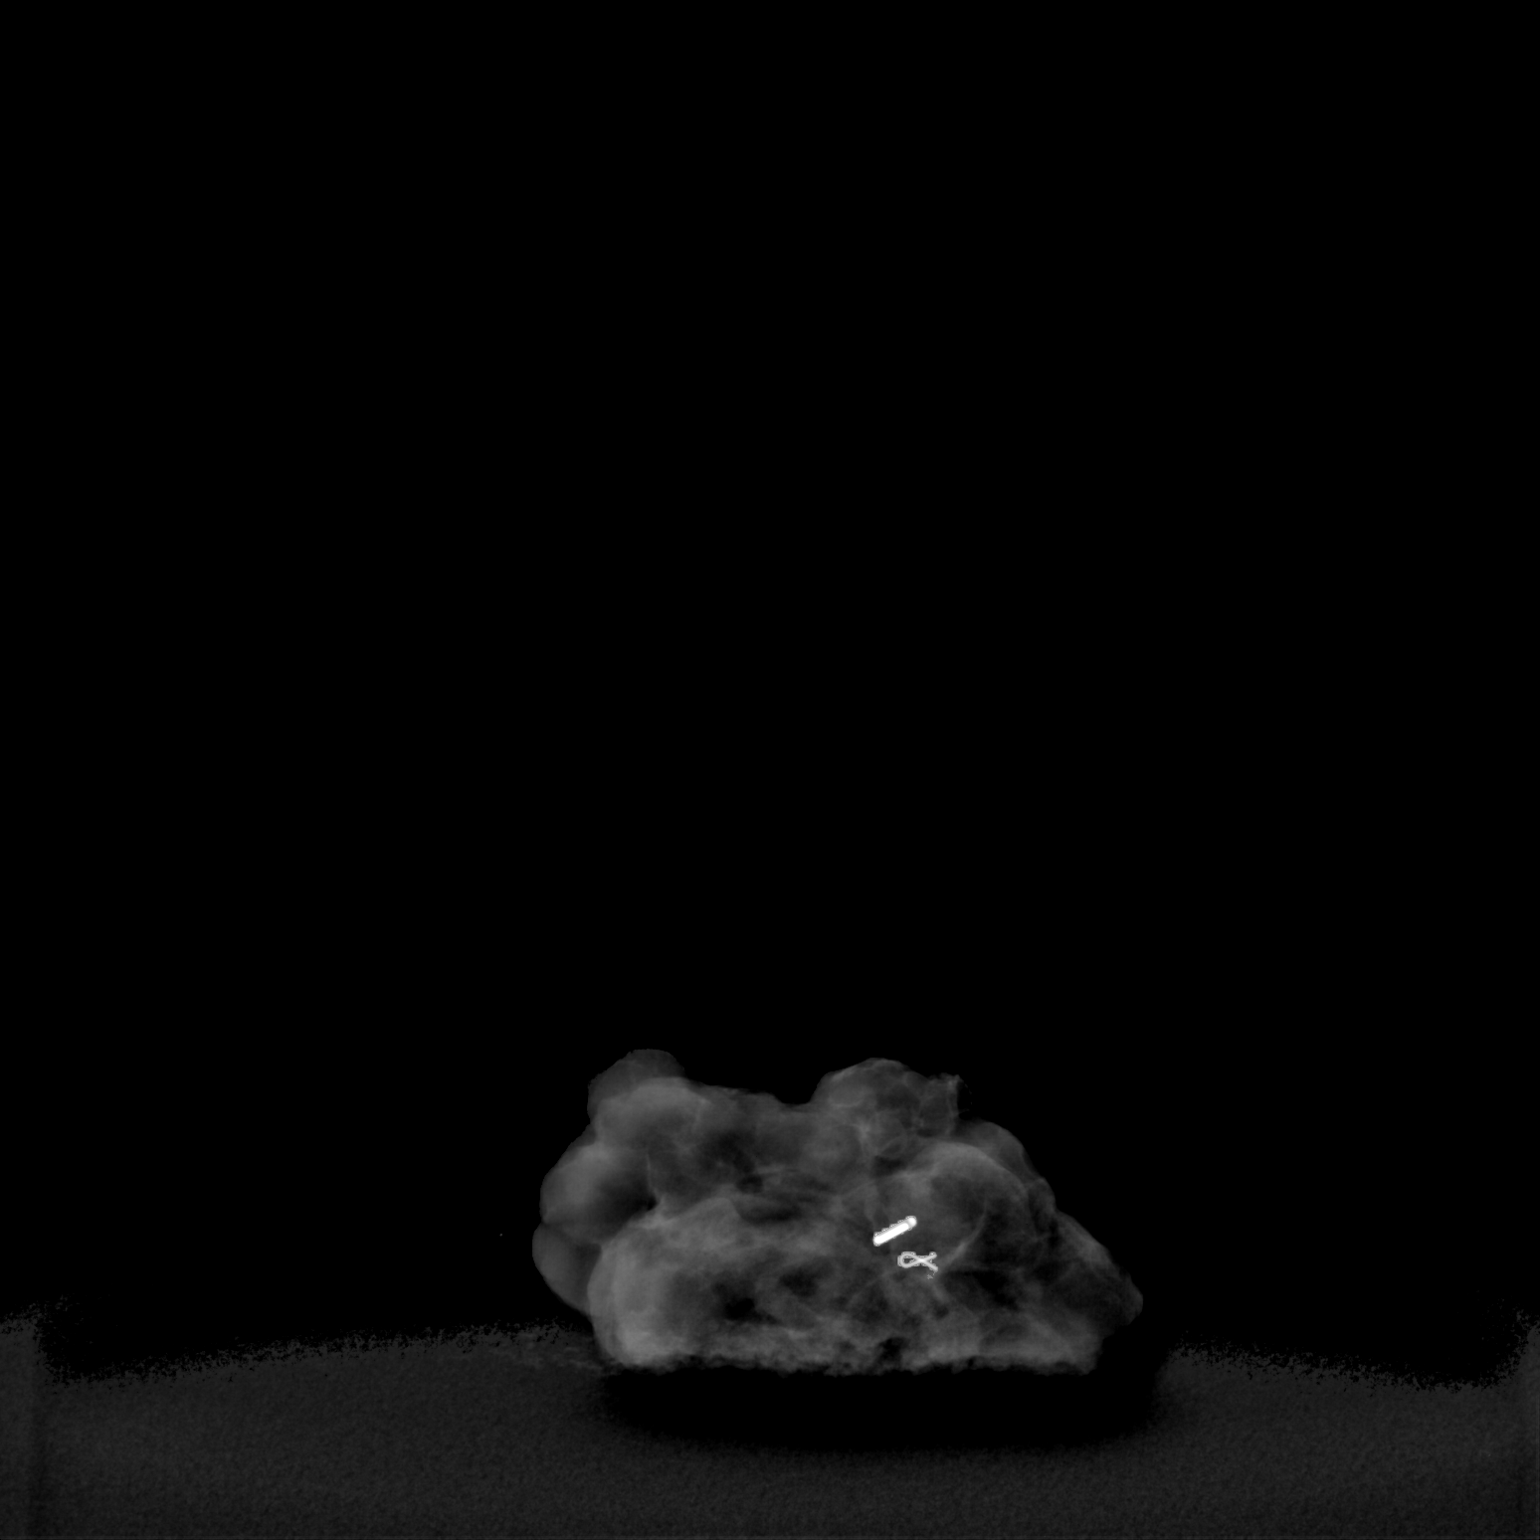
[im 2/2]
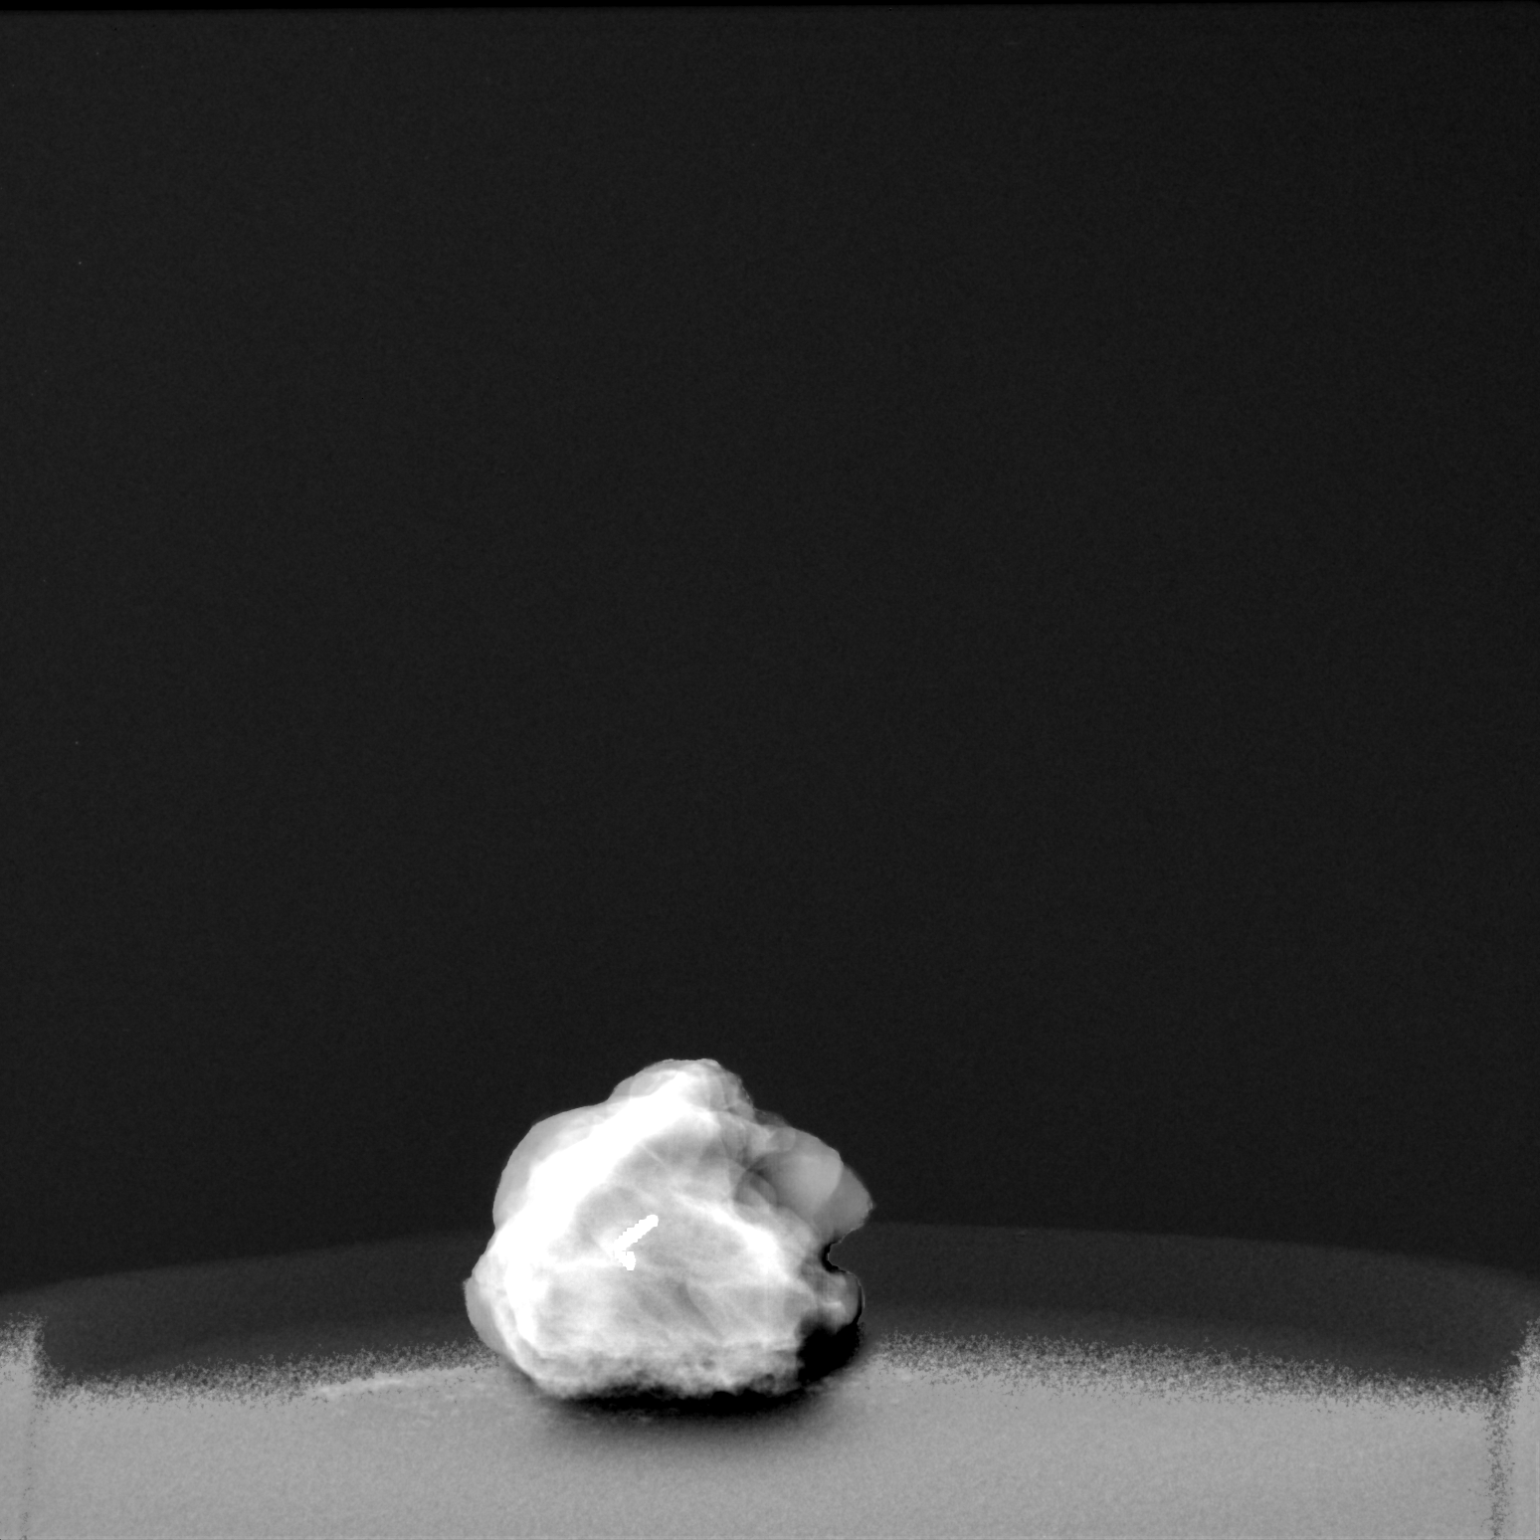

[2 of 2 positions shown; findings below may reference images not displayed]

FINDINGS: Status post excision of the RIGHT breast. The radioactive seed and
biopsy marker clip are present, completely intact, and were marked
for pathology.
IMPRESSION: Specimen radiograph of the RIGHT breast.

## 2019-12-24 ENCOUNTER — Other Ambulatory Visit: Payer: Self-pay

## 2019-12-24 ENCOUNTER — Encounter: Payer: BC Managed Care – PPO | Admitting: Gynecology

## 2019-12-24 ENCOUNTER — Encounter: Payer: Self-pay | Admitting: Obstetrics and Gynecology

## 2019-12-24 ENCOUNTER — Ambulatory Visit (INDEPENDENT_AMBULATORY_CARE_PROVIDER_SITE_OTHER): Payer: BC Managed Care – PPO | Admitting: Obstetrics and Gynecology

## 2019-12-24 VITALS — BP 118/74 | Ht 63.0 in | Wt 150.0 lb

## 2019-12-24 DIAGNOSIS — N951 Menopausal and female climacteric states: Secondary | ICD-10-CM | POA: Diagnosis not present

## 2019-12-24 DIAGNOSIS — Z01419 Encounter for gynecological examination (general) (routine) without abnormal findings: Secondary | ICD-10-CM

## 2019-12-24 NOTE — Progress Notes (Signed)
   Caroline Bridges [birth year] [id number]  SUBJECTIVE:  44 y.o. G3P3003 female for annual routine gynecologic exam. She has no gynecologic concerns.  Does note some increasing menstrual cycle length and irregularity.  LMP was 09/2019.  She also had a similar episode earlier in the year where she skipped a period.  She denies any excessively heavy menses.  She is starting to have some hot flashes at night starting the last month or so.  She is thinking this is menopausal related.  Indicates a history of thyroid dysregulation in the past but says she had recent normal thyroid labs.  Current Outpatient Medications  Medication Sig Dispense Refill  . ibuprofen (ADVIL,MOTRIN) 200 MG tablet Take 200 mg by mouth every 6 (six) hours as needed for headache.     No current facility-administered medications for this visit.   Allergies: Biaxin [clarithromycin], Codeine, Morphine and related, and Naproxen  Patient's last menstrual period was 10/09/2019.  Past medical history,surgical history, problem list, medications, allergies, family history and social history were all reviewed and documented as reviewed in the EPIC chart.  ROS: Pertinent positives and negatives as reviewed in HPI.  OBJECTIVE:  BP 118/74   Ht 5\' 3"  (1.6 m)   Wt 150 lb (68 kg)   LMP 10/09/2019   BMI 26.57 kg/m  The patient appears well, alert, oriented, in no distress. ENT normal.  Neck supple. No cervical or supraclavicular adenopathy or thyromegaly.  Lungs are clear, good air entry, no wheezes, rhonchi or rales. S1 and S2 normal, no murmurs, regular rate and rhythm.  Abdomen soft without tenderness, guarding, mass or organomegaly.  Neurological is normal, no focal findings.  BREAST EXAM: breasts appear normal, no suspicious masses, no skin or nipple changes or axillary nodes  PELVIC EXAM: VULVA: normal appearing vulva with no masses, tenderness or lesions, VAGINA: normal appearing vagina with normal color and discharge, no  lesions, CERVIX: normal appearing cervix without discharge or lesions, UTERUS: uterus is normal size, shape, consistency and nontender, ADNEXA: normal adnexa in size, nontender and no masses  Chaperone: 43/01/2020 present during the examination  ASSESSMENT:  44 y.o. 56 here for annual gynecologic exam  PLAN:   1.  Perimenopause and menstrual changes.  We discussed potential symptoms through this phase of life.  Onset of menstrual irregularity and skipping a period some months is consistent with perimenopause.  Offered to check [facility] and estradiol level labs but she declines.  She says she has recently had normal thyroid blood work performed elsewhere.  She will let [facility] know if there is onset of any excessive/frequent menstrual bleeding or any severe worsening of her symptoms. 2. Pap smear/HPV 2018.  Cryosurgery in 2000, LEEP 2009, normal Pap smears since then.  Next Pap smear due 2023 following the current guidelines recommending the 5 year interval. 3. Contraception.  Prior tubal ligation. 4. Mammogram 03/2019.  Breast exam normal.  Continue with annual mammograms. 5. Health maintenance.  No labs today as she normally has these completed elsewhere.  Return annually or sooner, prn.  04/2019 MD 12/24/19

## 2020-03-04 ENCOUNTER — Other Ambulatory Visit: Payer: Self-pay | Admitting: Obstetrics and Gynecology

## 2020-03-04 DIAGNOSIS — Z1231 Encounter for screening mammogram for malignant neoplasm of breast: Secondary | ICD-10-CM

## 2020-04-16 ENCOUNTER — Ambulatory Visit
Admission: RE | Admit: 2020-04-16 | Discharge: 2020-04-16 | Disposition: A | Discharge status: Home / Self Care | Payer: BC Managed Care – PPO | Source: Ambulatory Visit | Attending: Obstetrics and Gynecology | Admitting: Obstetrics and Gynecology

## 2020-04-16 ENCOUNTER — Ambulatory Visit: Payer: BC Managed Care – PPO

## 2020-04-16 ENCOUNTER — Other Ambulatory Visit: Payer: Self-pay

## 2020-04-16 DIAGNOSIS — Z1231 Encounter for screening mammogram for malignant neoplasm of breast: Secondary | ICD-10-CM

## 2020-04-22 ENCOUNTER — Other Ambulatory Visit: Payer: Self-pay | Admitting: Obstetrics and Gynecology

## 2020-04-22 DIAGNOSIS — R928 Other abnormal and inconclusive findings on diagnostic imaging of breast: Secondary | ICD-10-CM

## 2020-05-05 ENCOUNTER — Other Ambulatory Visit: Payer: Self-pay | Admitting: Obstetrics & Gynecology

## 2020-05-05 DIAGNOSIS — R928 Other abnormal and inconclusive findings on diagnostic imaging of breast: Secondary | ICD-10-CM

## 2020-05-07 ENCOUNTER — Ambulatory Visit
Admission: RE | Admit: 2020-05-07 | Discharge: 2020-05-07 | Disposition: A | Discharge status: Home / Self Care | Payer: BC Managed Care – PPO | Source: Ambulatory Visit | Attending: Obstetrics and Gynecology | Admitting: Obstetrics and Gynecology

## 2020-05-07 ENCOUNTER — Other Ambulatory Visit: Payer: Self-pay

## 2020-05-07 DIAGNOSIS — N6489 Other specified disorders of breast: Secondary | ICD-10-CM | POA: Diagnosis not present

## 2020-05-07 DIAGNOSIS — R928 Other abnormal and inconclusive findings on diagnostic imaging of breast: Secondary | ICD-10-CM

## 2020-05-07 DIAGNOSIS — R922 Inconclusive mammogram: Secondary | ICD-10-CM | POA: Diagnosis not present

## 2020-06-21 DIAGNOSIS — H5210 Myopia, unspecified eye: Secondary | ICD-10-CM | POA: Diagnosis not present

## 2020-12-24 ENCOUNTER — Ambulatory Visit: Payer: BC Managed Care – PPO | Admitting: Obstetrics and Gynecology

## 2021-01-26 ENCOUNTER — Ambulatory Visit: Payer: BC Managed Care – PPO | Admitting: Nurse Practitioner

## 2021-01-26 NOTE — Progress Notes (Deleted)
   Caroline Bridges June 12, 1975 [id number]   History:  45 y.o. G3P3003 presents for annual exam. Having irregular menses and menopausal symptoms. Cryosurgery in 2000, LEEP 2009, subsequent paps normal. 2020 right benign breast lumpectomy.   Gynecologic History No LMP recorded.   Contraception/Family planning: tubal ligation Sexually active: ***  Health Maintenance Last Pap: 10/27/2016. Results were: Normal, 5-year repeat Last mammogram: 04/16/2020. Results were: Right breast mass. U/S follow up showed dense fibroglandular tissue Last colonoscopy: Not indicated Last Dexa: Not indicated  Past medical history, past surgical history, family history and social history were all reviewed and documented in the EPIC chart.  ROS:  A ROS was performed and pertinent positives and negatives are included.  Exam:  There were no vitals filed for this visit. There is no height or weight on file to calculate BMI.  General appearance:  Normal Thyroid:  Symmetrical, normal in size, without palpable masses or nodularity. Respiratory  Auscultation:  Clear without wheezing or rhonchi Cardiovascular  Auscultation:  Regular rate, without rubs, murmurs or gallops  Edema/varicosities:  Not grossly evident Abdominal  Soft,nontender, without masses, guarding or rebound.  Liver/spleen:  No organomegaly noted  Hernia:  None appreciated  Skin  Inspection:  Grossly normal Breasts: Examined lying and sitting.   Right: Without masses, retractions, nipple discharge or axillary adenopathy.   Left: Without masses, retractions, nipple discharge or axillary adenopathy. Genitourinary   Inguinal/mons:  Normal without inguinal adenopathy  External genitalia:  Normal appearing vulva with no masses, tenderness, or lesions  BUS/Urethra/Skene's glands:  Normal  Vagina:  Normal appearing with normal color and discharge, no lesions  Cervix:  Normal appearing without discharge or lesions  Uterus:  Normal in size,  shape and contour.  Midline and mobile, nontender  Adnexa/parametria:     Rt: Normal in size, without masses or tenderness.   Lt: Normal in size, without masses or tenderness.  Patient informed chaperone available to be present for breast and pelvic exam. Patient has requested no chaperone to be present. Patient has been advised what will be completed during breast and pelvic exam.   Assessment/Plan:  45 y.o. G3P3003 for annual exam.    Return in 1 year for annual.   Olivia Mackie DNP, 7:56 AM 01/26/2021

## 2021-02-22 ENCOUNTER — Other Ambulatory Visit: Payer: Self-pay | Admitting: Obstetrics & Gynecology

## 2021-02-22 DIAGNOSIS — Z1231 Encounter for screening mammogram for malignant neoplasm of breast: Secondary | ICD-10-CM

## 2021-03-14 NOTE — Progress Notes (Signed)
Caroline Bridges 03/26/1975 [id number]   History:  46 y.o. G3P3003  presents for annual exam. Having cycle every 3-4 months. Menses for the past year have been much worse than her previous menses. Heavy bleeding with clots first 2 days, then tapers off with total bleeding time of 7 days. She experiences severe cramping that is debilitating and radiates to her lower back and down both legs making it difficult to walk. Cramping lasts all 7 days. She takes Midol and 800 mg Ibuprofen that will help for a couple of hours. She has night sweats. 2000 cryosurgery, 2009 LEEP, normal paps since. History of hyperthyroidism years ago, mother with hypothyroidism. Smoker.  Gynecologic History No LMP recorded. Period Cycle (Days): 60 Period Duration (Days): 7 Period Pattern: (!) Irregular Menstrual Flow: Heavy Dysmenorrhea: (!) Severe Dysmenorrhea Symptoms: Cramping Contraception/Family planning: tubal ligation Sexually active: Yes  Health Maintenance Last Pap: 10/27/2016. Results were: Normal, 5-year repeat Last mammogram: 04/16/2020. Results were: Possible mass in left breast, normal f/u ultrasound Last colonoscopy: Never Last Dexa: Not indicated  Past medical history, past surgical history, family history and social history were all reviewed and documented in the EPIC chart. Works night shift as Merchandiser, retail at [employer]. Son 25 in [place], married, 2 daughters ages 3 weeks and 3 year. 8 yo son lives with her. 81 yo in [place] in [place]  ROS:  A ROS was performed and pertinent positives and negatives are included.  Exam:  Vitals:   03/15/21 0912  BP: 122/68  Weight: 142 lb 6.4 oz (64.6 kg)  Height: 5\' 3"  (1.6 m)   Body mass index is 25.23 kg/m.  General appearance:  Normal Thyroid:  Symmetrical, normal in size, without palpable masses or nodularity. Respiratory  Auscultation:  Clear without wheezing or rhonchi Cardiovascular  Auscultation:  Regular rate, without rubs, murmurs or  gallops  Edema/varicosities:  Not grossly evident Abdominal  Soft,nontender, without masses, guarding or rebound.  Liver/spleen:  No organomegaly noted  Hernia:  None appreciated  Skin  Inspection:  Grossly normal Breasts: Examined lying and sitting.   Right: Without masses, retractions, nipple discharge or axillary adenopathy.   Left: Without masses, retractions, nipple discharge or axillary adenopathy. Genitourinary   Inguinal/mons:  Normal without inguinal adenopathy  External genitalia:  Normal appearing vulva with no masses, tenderness, or lesions  BUS/Urethra/Skene's glands:  Normal  Vagina:  Normal appearing with normal color and discharge, no lesions  Cervix:  Normal appearing without discharge or lesions  Uterus:  Normal in size, shape and contour.  Midline and mobile, nontender  Adnexa/parametria:     Rt: Normal in size, without masses or tenderness.   Lt: Normal in size, without masses or tenderness.  Anus and perineum: Normal  Digital rectal exam: Normal sphincter tone without palpated masses or tenderness  Patient informed chaperone available to be present for breast and pelvic exam. Patient has requested no chaperone to be present. Patient has been advised what will be completed during breast and pelvic exam.   Assessment/Plan:  46 y.o. G3P3003 for annual exam.   Well female exam with routine gynecological exam - Plan: Cytology - PAP( [place]). Education provided on SBEs, importance of preventative screenings, current guidelines, high calcium diet, regular exercise, and multivitamin daily. Labs done through work (except TSH).   Dysmenorrhea - Plan: 59 PELVIS TRANSVAGINAL NON-OB (TV ONLY). She experiences severe cramping that is debilitating and radiates to her lower back and down both legs making it difficult to walk. Cramping lasts all  7 days. She takes Midol and 800 mg Ibuprofen that will help for a couple of hours.  Menorrhagia with irregular cycle - Plan: US  PELVIS TRANSVAGINAL NON-OB (TV ONLY). Having cycles every 3-4 months. Discussed perimenopause and expected changes in bleeding patterns. We did discuss management with hormonal contraception if normal ultrasound. Progestin-only due to smoking status.   History of hyperthyroidism - Plan: TSH  Family history of thyroid disease in mother - Plan: TSH  Screening for cervical cancer - Normal Pap history. Pap today.   Screening for breast cancer - Normal mammogram history.  Continue annual screenings.  Normal breast exam today. Mammogram scheduled  04/18/2021.   Screening for colon cancer - Discussed current guidelines and recommendations to start screenings at age 70.   Return in 1 year for annual.     Olivia Mackie DNP, 9:28 AM 03/15/2021

## 2021-03-15 ENCOUNTER — Other Ambulatory Visit (HOSPITAL_COMMUNITY)
Admission: RE | Admit: 2021-03-15 | Discharge: 2021-03-15 | Disposition: A | Discharge status: Home / Self Care | Payer: BC Managed Care – PPO | Source: Ambulatory Visit | Attending: Nurse Practitioner | Admitting: Nurse Practitioner

## 2021-03-15 ENCOUNTER — Encounter: Payer: Self-pay | Admitting: Nurse Practitioner

## 2021-03-15 ENCOUNTER — Other Ambulatory Visit: Payer: Self-pay

## 2021-03-15 ENCOUNTER — Ambulatory Visit (INDEPENDENT_AMBULATORY_CARE_PROVIDER_SITE_OTHER): Payer: BC Managed Care – PPO | Admitting: Nurse Practitioner

## 2021-03-15 VITALS — BP 122/68 | Ht 63.0 in | Wt 142.4 lb

## 2021-03-15 DIAGNOSIS — N946 Dysmenorrhea, unspecified: Secondary | ICD-10-CM

## 2021-03-15 DIAGNOSIS — N921 Excessive and frequent menstruation with irregular cycle: Secondary | ICD-10-CM

## 2021-03-15 DIAGNOSIS — Z8639 Personal history of other endocrine, nutritional and metabolic disease: Secondary | ICD-10-CM

## 2021-03-15 DIAGNOSIS — Z01419 Encounter for gynecological examination (general) (routine) without abnormal findings: Secondary | ICD-10-CM

## 2021-03-15 DIAGNOSIS — Z8349 Family history of other endocrine, nutritional and metabolic diseases: Secondary | ICD-10-CM

## 2021-03-15 DIAGNOSIS — N951 Menopausal and female climacteric states: Secondary | ICD-10-CM

## 2021-03-16 ENCOUNTER — Other Ambulatory Visit: Payer: Self-pay | Admitting: Nurse Practitioner

## 2021-03-16 DIAGNOSIS — B9689 Other specified bacterial agents as the cause of diseases classified elsewhere: Secondary | ICD-10-CM

## 2021-03-16 LAB — CYTOLOGY - PAP
Adequacy: ABSENT
Comment: NEGATIVE
Diagnosis: NEGATIVE
High risk HPV: NEGATIVE

## 2021-03-16 MED ORDER — METRONIDAZOLE 0.75 % VA GEL: 1.0000 | Freq: Every day | VAGINAL | 0 refills | Status: AC

## 2021-04-07 ENCOUNTER — Encounter: Payer: Self-pay | Admitting: Obstetrics and Gynecology

## 2021-04-07 ENCOUNTER — Other Ambulatory Visit: Payer: Self-pay

## 2021-04-07 ENCOUNTER — Ambulatory Visit: Payer: BC Managed Care – PPO | Admitting: Obstetrics and Gynecology

## 2021-04-07 ENCOUNTER — Ambulatory Visit (INDEPENDENT_AMBULATORY_CARE_PROVIDER_SITE_OTHER): Payer: BC Managed Care – PPO

## 2021-04-07 VITALS — BP 128/74 | HR 65 | Ht 63.0 in | Wt 145.0 lb

## 2021-04-07 DIAGNOSIS — N760 Acute vaginitis: Secondary | ICD-10-CM

## 2021-04-07 DIAGNOSIS — N854 Malposition of uterus: Secondary | ICD-10-CM

## 2021-04-07 DIAGNOSIS — N946 Dysmenorrhea, unspecified: Secondary | ICD-10-CM

## 2021-04-07 DIAGNOSIS — Z8742 Personal history of other diseases of the female genital tract: Secondary | ICD-10-CM

## 2021-04-07 DIAGNOSIS — B9689 Other specified bacterial agents as the cause of diseases classified elsewhere: Secondary | ICD-10-CM

## 2021-04-07 DIAGNOSIS — Z8349 Family history of other endocrine, nutritional and metabolic diseases: Secondary | ICD-10-CM | POA: Diagnosis not present

## 2021-04-07 DIAGNOSIS — N951 Menopausal and female climacteric states: Secondary | ICD-10-CM | POA: Diagnosis not present

## 2021-04-07 DIAGNOSIS — N912 Amenorrhea, unspecified: Secondary | ICD-10-CM

## 2021-04-07 DIAGNOSIS — N921 Excessive and frequent menstruation with irregular cycle: Secondary | ICD-10-CM | POA: Diagnosis not present

## 2021-04-07 LAB — PROLACTIN: Prolactin: 5.8 ng/mL

## 2021-04-07 LAB — FOLLICLE STIMULATING HORMONE: FSH: 116.7 m[IU]/mL — ABNORMAL HIGH

## 2021-04-07 LAB — TSH: TSH: 0.47 mIU/L

## 2021-04-07 MED ORDER — METRONIDAZOLE 500 MG PO TABS: 500.0000 mg | ORAL_TABLET | Freq: Two times a day (BID) | ORAL | 0 refills | Status: DC

## 2021-04-07 MED ORDER — MEDROXYPROGESTERONE ACETATE 5 MG PO TABS: ORAL_TABLET | ORAL | 1 refills | Status: DC

## 2021-04-07 MED ORDER — IBUPROFEN 800 MG PO TABS: 800.0000 mg | ORAL_TABLET | Freq: Three times a day (TID) | ORAL | 1 refills | Status: DC | PRN

## 2021-04-07 NOTE — Patient Instructions (Signed)
Menopause Menopause is the normal time of a woman's life when menstrual periods stop completely. It marks the natural end to a woman's ability to become pregnant. It can be defined as the absence of a menstrual period for 12 months without another medical cause. The transition to menopause (perimenopause) most often happens between the ages of 45 and 55, and can last for many years. During perimenopause, hormone levels change in your body, which can cause symptoms and affect your health. Menopause may increase your risk for: Weakened bones (osteoporosis), which causes fractures. Depression. Hardening and narrowing of the arteries (atherosclerosis), which can cause heart attacks and strokes. What are the causes? This condition is usually caused by a natural change in hormone levels that happens as you get older. The condition may also be caused by changes that are not natural, including: Surgery to remove both ovaries (surgical menopause). Side effects from some medicines, such as chemotherapy used to treat cancer (chemical menopause). What increases the risk? This condition is more likely to start at an earlier age if you have certain medical conditions or have undergone treatments, including: A tumor of the pituitary gland in the brain. A disease that affects the ovaries and hormones. Certain cancer treatments, such as chemotherapy or hormone therapy, or radiation therapy on the pelvis. Heavy smoking and excessive alcohol use. Family history of early menopause. This condition is also more likely to develop earlier in women who are very thin. What are the signs or symptoms? Symptoms of this condition include: Hot flashes. Irregular menstrual periods. Night sweats. Changes in feelings about sex. This could be a decrease in sex drive or an increased discomfort around your sexuality. Vaginal dryness and thinning of the vaginal walls. This may cause painful sex. Dryness of the skin and  development of wrinkles. Headaches. Problems sleeping (insomnia). Mood swings or irritability. Memory problems. Weight gain. Hair growth on the face and chest. Bladder infections or problems with urinating. How is this diagnosed? This condition is diagnosed based on your medical history, a physical exam, your age, your menstrual history, and your symptoms. Hormone tests may also be done. How is this treated? In some cases, no treatment is needed. You and your health care provider should make a decision together about whether treatment is necessary. Treatment will be based on your individual condition and preferences. Treatment for this condition focuses on managing symptoms. Treatment may include: Menopausal hormone therapy (MHT). Medicines to treat specific symptoms or complications. Acupuncture. Vitamin or herbal supplements. Before starting treatment, make sure to let your health care provider know if you have a personal or family history of these conditions: Heart disease. Breast cancer. Blood clots. Diabetes. Osteoporosis. Follow these instructions at home: Lifestyle Do not use any products that contain nicotine or tobacco, such as cigarettes, e-cigarettes, and chewing tobacco. If you need help quitting, ask your health care provider. Get at least 30 minutes of physical activity on 5 or more days each week. Avoid alcoholic and caffeinated beverages, as well as spicy foods. This may help prevent hot flashes. Get 7-8 hours of sleep each night. If you have hot flashes, try: Dressing in layers. Avoiding things that may trigger hot flashes, such as spicy food, warm places, or stress. Taking slow, deep breaths when a hot flash starts. Keeping a fan in your home and office. Find ways to manage stress, such as deep breathing, meditation, or journaling. Consider going to group therapy with other women who are having menopause symptoms. Ask your health care   provider about recommended  group therapy meetings. Eating and drinking  Eat a healthy, balanced diet that contains whole grains, lean protein, low-fat dairy, and plenty of fruits and vegetables. Your health care provider may recommend adding more soy to your diet. Foods that contain soy include tofu, tempeh, and soy milk. Eat plenty of foods that contain calcium and vitamin D for bone health. Items that are rich in calcium include low-fat milk, yogurt, beans, almonds, sardines, broccoli, and kale. Medicines Take over-the-counter and prescription medicines only as told by your health care provider. Talk with your health care provider before starting any herbal supplements. If prescribed, take vitamins and supplements as told by your health care provider. General instructions  Keep track of your menstrual periods, including: When they occur. How heavy they are and how long they last. How much time passes between periods. Keep track of your symptoms, noting when they start, how often you have them, and how long they last. Use vaginal lubricants or moisturizers to help with vaginal dryness and improve comfort during sex. Keep all follow-up visits. This is important. This includes any group therapy or counseling. Contact a health care provider if: You are still having menstrual periods after age 55. You have pain during sex. You have not had a period for 12 months and you develop vaginal bleeding. Get help right away if you have: Severe depression. Excessive vaginal bleeding. Pain when you urinate. A fast or irregular heartbeat (palpitations). Severe headaches. Abdominal pain or severe indigestion. Summary Menopause is a normal time of life when menstrual periods stop completely. It is usually defined as the absence of a menstrual period for 12 months without another medical cause. The transition to menopause (perimenopause) most often happens between the ages of 45 and 55 and can last for several years. Symptoms  can be managed through medicines, lifestyle changes, and complementary therapies such as acupuncture. Eat a balanced diet that is rich in nutrients to promote bone health and heart health and to manage symptoms during menopause. This information is not intended to replace advice given to you by your health care provider. Make sure you discuss any questions you have with your health care provider. Document Revised: 11/14/2019 Document Reviewed: 07/31/2019 Elsevier Patient Education  2022 [employer] Inc[employer]  

## 2021-04-07 NOTE — Progress Notes (Signed)
GYNECOLOGY  VISIT   HPI: 46 y.o.   Divorced White or Caucasian Not Hispanic or Latino  female   [phone] with Patient's last menstrual period was 10/08/2020.   here for ultrasound consult for heavy, irregular cycles with severe dysmenorrhea.    Prior to the last 2 years her cycles were monthly. Then started skipping a month at a time. Now it has been 6 months since she has a cycle.  She is having hot flashes and night sweats, wakes up at least 2-3 x a night. No galactorrhea. Mild vaginal dryness.   She was diagnosed with BV on her pap last month, is having a little discharge. She never picked up the metrogel, would prefer oral flagyl.   GYNECOLOGIC HISTORY: Patient's last menstrual period was 10/08/2020. Contraception:tubal ligation  Menopausal hormone therapy: none         OB History     Gravida  3   Para  3   Term  3   Preterm      AB      Living  3      SAB      IAB      Ectopic      Multiple      Live Births                 There are no problems to display for this patient.   Past Medical History:  Diagnosis Date   Breast mass, right    Cervical dysplasia    Complication of anesthesia    with knee surgery had nausea   History of kidney stones    PONV (postoperative nausea and vomiting)     Past Surgical History:  Procedure Laterality Date   BREAST BIOPSY     BREAST EXCISIONAL BIOPSY Right 05/02/2018   benign    BREAST LUMPECTOMY WITH RADIOACTIVE SEED LOCALIZATION Right 05/02/2018   Procedure: RIGHT BREAST LUMPECTOMY WITH RADIOACTIVE SEED LOCALIZATION;  Surgeon: Erroll Luna, MD;  Location: [facility];  Service: General;  Laterality: Right;   CERVICAL BIOPSY  W/ LOOP ELECTRODE EXCISION  2009   Cyst removed from neck     [facility]      Current Outpatient Medications  Medication Sig Dispense Refill   ibuprofen (ADVIL,MOTRIN) 200 MG tablet  Take 200 mg by mouth every 6 (six) hours as needed for headache.     No current facility-administered medications for this visit.     ALLERGIES: Biaxin [clarithromycin], Codeine, Morphine and related, and Naproxen  Family History  Problem Relation Age of Onset   Myasthenia gravis Father    Breast cancer Maternal Aunt        40's   Lung cancer Maternal Aunt    Ovarian cancer Paternal Grandmother     Social History   Socioeconomic History   Marital status: Divorced    Spouse name: Not on file   Number of children: Not on file   Years of education: Not on file   Highest education level: Not on file  Occupational History   Not on file  Tobacco Use   Smoking status: Every Day    Packs/day: 1.00    Types: Cigarettes   Smokeless tobacco: Never  Vaping Use   Vaping Use: Never used  Substance and Sexual Activity   Alcohol use: Yes    Alcohol/week: 0.0  standard drinks    Comment: Rare   Drug use: No   Sexual activity: Not Currently    Birth control/protection: Surgical    Comment: Tubal lig-1st intercourse 16 yo-5 partners  Other Topics Concern   Not on file  Social History Narrative   Not on file   Social Determinants of Health   Financial Resource Strain: Not on file  Food Insecurity: Not on file  Transportation Needs: Not on file  Physical Activity: Not on file  Stress: Not on file  Social Connections: Not on file  Intimate Partner Violence: Not on file    Review of Systems  All other systems reviewed and are negative.  PHYSICAL EXAMINATION:    BP 128/74    Pulse 65    Ht 5\' 3"  (1.6 m)    Wt 145 lb (65.8 kg)    LMP 10/08/2020    SpO2 100%    BMI 25.69 kg/m     General appearance: alert, cooperative and appears stated age  Pelvic ultrasound  Indications: h/o severe dysmenorrhea, irregular heavy cycles  Findings:  Uterus 6.02 x 4.26 x 3.0 cm, anteverted  Endometrium 2.67 mm, thin, uniform  Left ovary 2.53 x 1.64 x 1.60 cm Small simple follicle  seen  Right ovary 2.63 x 0.96 x 1.57 cm  No free fluid  Impression:  Normal sized, anteverted uterus Thin, symmetrical endometrium Normal ovaries bilaterally  1. Amenorrhea Suspect she is perimenopausal. - TSH - Follicle stimulating hormone - Prolactin - medroxyPROGESTERone (PROVERA) 5 MG tablet; Take one tablet a day for 5 days now and every other month until you go 6 months without bleeding.  Dispense: 15 tablet; Refill: 1  2. BV (bacterial vaginosis) On pap, mild symptoms, desires treatment - metroNIDAZOLE (FLAGYL) 500 MG tablet; Take 1 tablet (500 mg total) by mouth 2 (two) times daily.  Dispense: 14 tablet; Refill: 0  3. Perimenopausal vasomotor symptoms Discussed behavioral changes Call if worsening  4. History of dysmenorrhea Try and start the ibuprofen prior to the cramps getting bad - ibuprofen (ADVIL) 800 MG tablet; Take 1 tablet (800 mg total) by mouth every 8 (eight) hours as needed.  Dispense: 30 tablet; Refill: 1  In addition to reviewing the ultrasound, over 20 minutes was spent in total patient care (counseling and management)

## 2021-04-18 ENCOUNTER — Ambulatory Visit
Admission: RE | Admit: 2021-04-18 | Discharge: 2021-04-18 | Disposition: A | Discharge status: Home / Self Care | Payer: BC Managed Care – PPO | Source: Ambulatory Visit | Attending: Obstetrics & Gynecology | Admitting: Obstetrics & Gynecology

## 2021-04-18 DIAGNOSIS — Z1231 Encounter for screening mammogram for malignant neoplasm of breast: Secondary | ICD-10-CM | POA: Diagnosis not present

## 2021-08-16 DIAGNOSIS — S60222A Contusion of left hand, initial encounter: Secondary | ICD-10-CM | POA: Diagnosis not present

## 2021-08-16 DIAGNOSIS — S60221A Contusion of right hand, initial encounter: Secondary | ICD-10-CM | POA: Diagnosis not present

## 2021-09-27 DIAGNOSIS — H5213 Myopia, bilateral: Secondary | ICD-10-CM | POA: Diagnosis not present

## 2021-09-27 DIAGNOSIS — H5210 Myopia, unspecified eye: Secondary | ICD-10-CM | POA: Diagnosis not present

## 2022-04-03 ENCOUNTER — Other Ambulatory Visit: Payer: Self-pay | Admitting: Obstetrics & Gynecology

## 2022-04-03 DIAGNOSIS — Z1231 Encounter for screening mammogram for malignant neoplasm of breast: Secondary | ICD-10-CM

## 2022-05-01 ENCOUNTER — Encounter: Payer: Self-pay | Admitting: Nurse Practitioner

## 2022-05-11 ENCOUNTER — Ambulatory Visit (INDEPENDENT_AMBULATORY_CARE_PROVIDER_SITE_OTHER): Payer: BC Managed Care – PPO | Admitting: Nurse Practitioner

## 2022-05-11 ENCOUNTER — Encounter: Payer: Self-pay | Admitting: Nurse Practitioner

## 2022-05-11 VITALS — BP 112/70 | HR 78 | Ht 63.0 in | Wt 148.0 lb

## 2022-05-11 DIAGNOSIS — Z8639 Personal history of other endocrine, nutritional and metabolic disease: Secondary | ICD-10-CM

## 2022-05-11 DIAGNOSIS — N951 Menopausal and female climacteric states: Secondary | ICD-10-CM | POA: Diagnosis not present

## 2022-05-11 DIAGNOSIS — Z01419 Encounter for gynecological examination (general) (routine) without abnormal findings: Secondary | ICD-10-CM

## 2022-05-11 DIAGNOSIS — Z8742 Personal history of other diseases of the female genital tract: Secondary | ICD-10-CM

## 2022-05-11 MED ORDER — IBUPROFEN 800 MG PO TABS: 800.0000 mg | ORAL_TABLET | Freq: Three times a day (TID) | ORAL | 1 refills | Status: AC | PRN

## 2022-05-11 NOTE — Progress Notes (Signed)
Caroline Bridges 1975-10-01 [id number]   History:  47 y.o. G3P3003  presents for annual exam. Perimenopausal. LMP August 2023. Having some night sweats but they are tolerable. [place] 117 a year ago. 2000 cryosurgery, 2009 LEEP, normal paps since. History of hyperthyroidism years ago, mother with hypothyroidism. Smoker. Mother recently had abnormal colonoscopy, malignancy suspected.   Gynecologic History No LMP recorded.   Contraception/Family planning: tubal ligation Sexually active: Yes  Health Maintenance Last Pap: 03/15/2021. Results were: Normal neg HPV, 3-year repeat Last mammogram: 04/18/2021. Results were: Normal. Scheduled tomorrow Last colonoscopy: Never Last Dexa: Not indicated  Past medical history, past surgical history, family history and social history were all reviewed and documented in the EPIC chart. 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 Son 52 in [place], married, 2 daughters ages 18 and 77. 64 yo son. 72 yo in [place] in [employer].   ROS:  A ROS was performed and pertinent positives and negatives are included.  Exam:  Vitals:   05/11/22 1604  BP: 112/70  Pulse: 78  SpO2: 92%  Weight: 148 lb (67.1 kg)  Height: '5\' 3"'$  (1.6 m)    Body mass index is 26.22 kg/m.  General appearance:  Normal Thyroid:  Symmetrical, normal in size, without palpable masses or nodularity. Respiratory  Auscultation:  Clear without wheezing or rhonchi Cardiovascular  Auscultation:  Regular rate, without rubs, murmurs or gallops  Edema/varicosities:  Not grossly evident Abdominal  Soft,nontender, without masses, guarding or rebound.  Liver/spleen:  No organomegaly noted  Hernia:  None appreciated  Skin  Inspection:  Grossly normal Breasts: Examined lying and sitting.   Right: Without masses, retractions, nipple discharge or axillary adenopathy.   Left: Without masses, retractions, nipple discharge or axillary adenopathy. Genitourinary   Inguinal/mons:  Normal  without inguinal adenopathy  External genitalia:  Normal appearing vulva with no masses, tenderness, or lesions  BUS/Urethra/Skene's glands:  Normal  Vagina:  Normal appearing with normal color and discharge, no lesions  Cervix:  Normal appearing without discharge or lesions  Uterus:  Normal in size, shape and contour.  Midline and mobile, nontender  Adnexa/parametria:     Rt: Normal in size, without masses or tenderness.   Lt: Normal in size, without masses or tenderness.  Anus and perineum: Normal  Digital rectal exam: Deferred  Patient informed chaperone available to be present for breast and pelvic exam. Patient has requested no chaperone to be present. Patient has been advised what will be completed during breast and pelvic exam.   Assessment/Plan:  47 y.o. [id number] for annual exam.   Well female exam with routine gynecological exam -  Education provided on SBEs, importance of preventative screenings, current guidelines, high calcium diet, regular exercise, and multivitamin daily. Labs with PCP.   Perimenopausal - LMP August 2023. Having some night sweats but they are tolerable. [place] 117 a year ago. Discussed triggers - caffeine, nicotine, and alcohol. She is a smoker. We discussed lifestyle changes.   History of dysmenorrhea - Plan: ibuprofen (ADVIL) 800 MG tablet as needed. Still experiences menstrual-like cramping occasionally.   Screening for cervical cancer - 2000 cryosurgery, 2009 LEEP, normal paps since. Will repeat at 3-year interval per guidelines.   Screening for breast cancer - Normal mammogram history.  Continue annual screenings.  Normal breast exam today. Mammogram scheduled tomorrow.  Screening for colon cancer - Discussed current guidelines and importance of preventative screenings. Plans to schedule colonoscopy where mother goes.   Return in 1 year for annual.  Tamela Gammon DNP, 4:29 PM 05/11/2022

## 2022-05-12 ENCOUNTER — Ambulatory Visit
Admission: RE | Admit: 2022-05-12 | Discharge: 2022-05-12 | Disposition: A | Discharge status: Home / Self Care | Payer: BC Managed Care – PPO | Source: Ambulatory Visit | Attending: Obstetrics & Gynecology | Admitting: Obstetrics & Gynecology

## 2022-05-12 DIAGNOSIS — Z1231 Encounter for screening mammogram for malignant neoplasm of breast: Secondary | ICD-10-CM | POA: Diagnosis not present

## 2022-05-18 IMAGING — MG MM DIGITAL SCREENING BILAT W/ TOMO AND CAD
8 series · 9 of 24 positions shown · non-contrast
Comparison: Previous exam(s).

CLINICAL DATA: Screening.

EXAM:
DIGITAL SCREENING BILATERAL MAMMOGRAM WITH TOMOSYNTHESIS AND CAD
TECHNIQUE: Bilateral screening digital craniocaudal and mediolateral oblique
mammograms were obtained. Bilateral screening digital breast
tomosynthesis was performed. The images were evaluated with
computer-aided detection.

[R CC synth-2D]
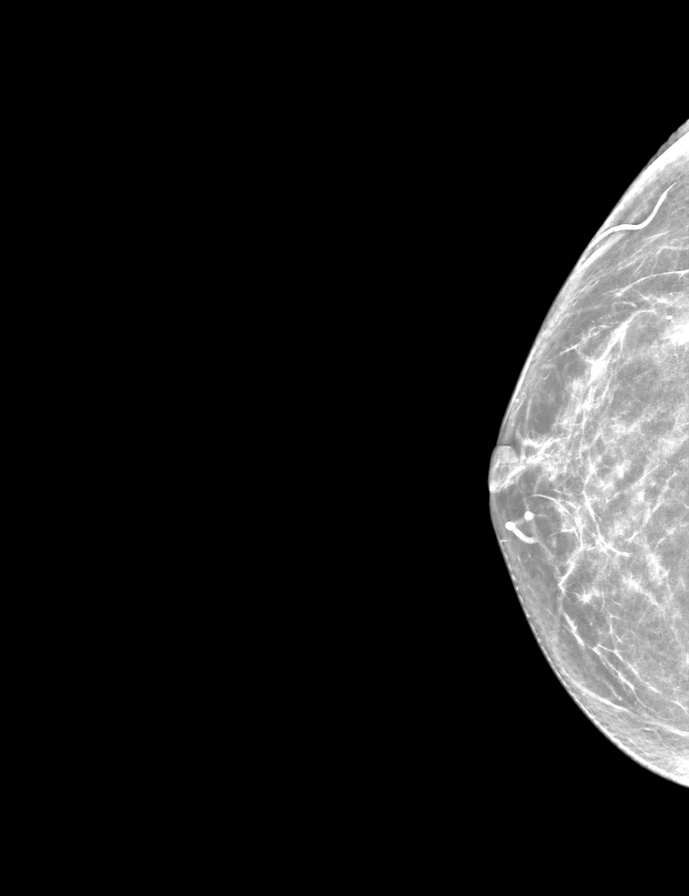

[L CC synth-2D]
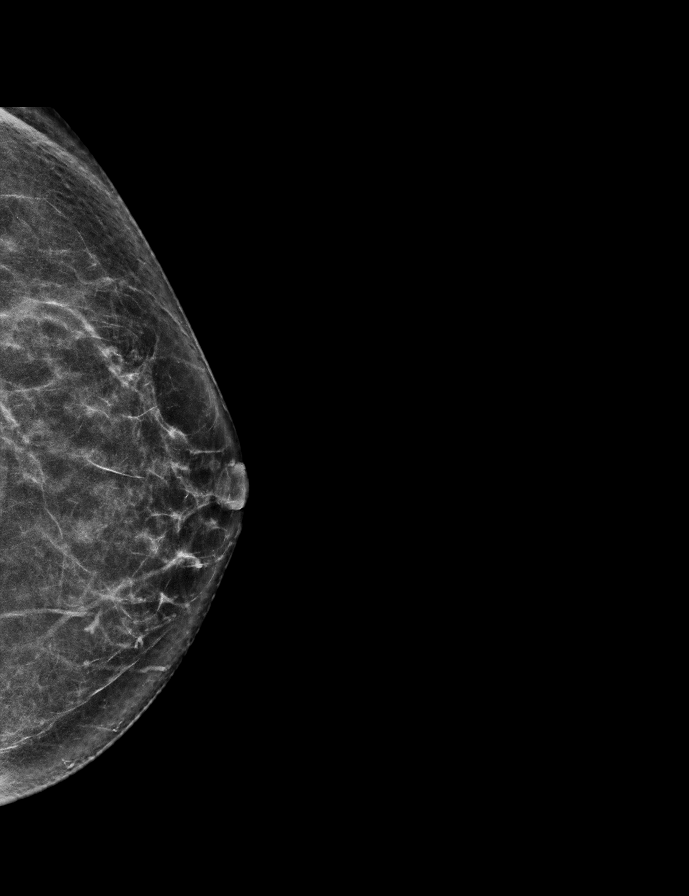

[L MLO synth-2D]
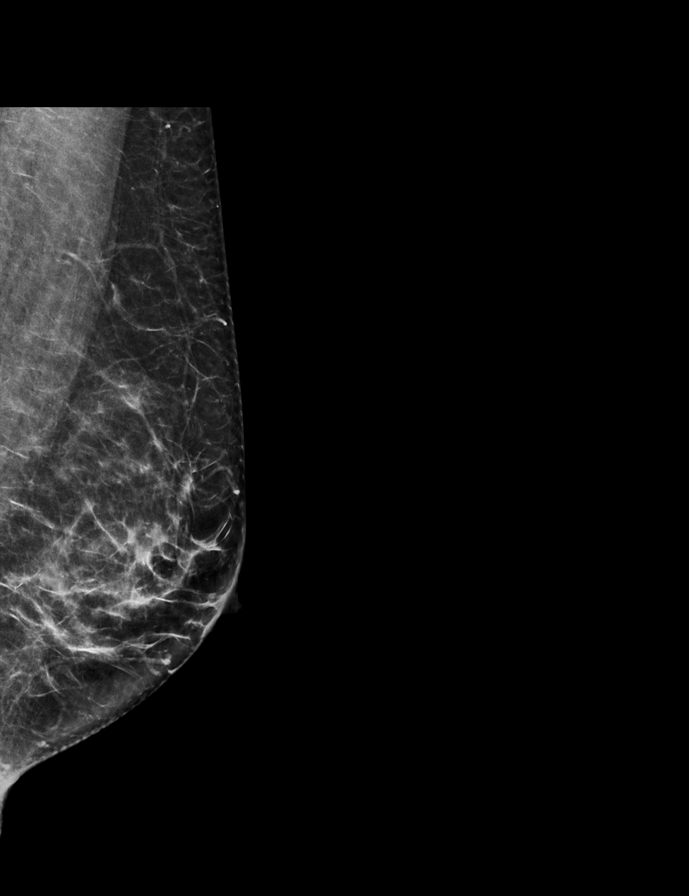

[R MLO synth-2D]
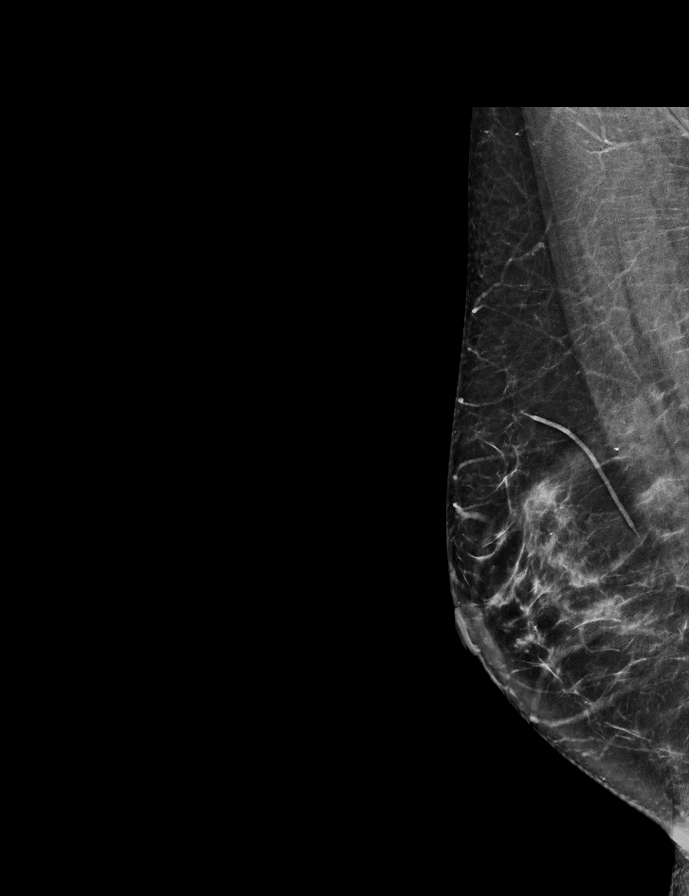

[L MLO tomo · 2 of 65 frames shown]
[frame 21/65]
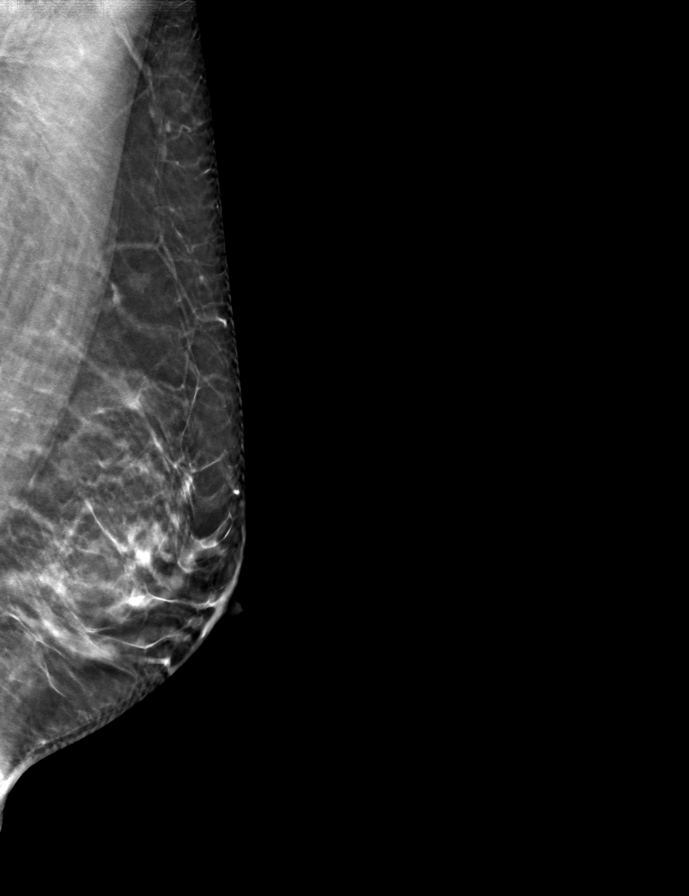
[frame 33/65]
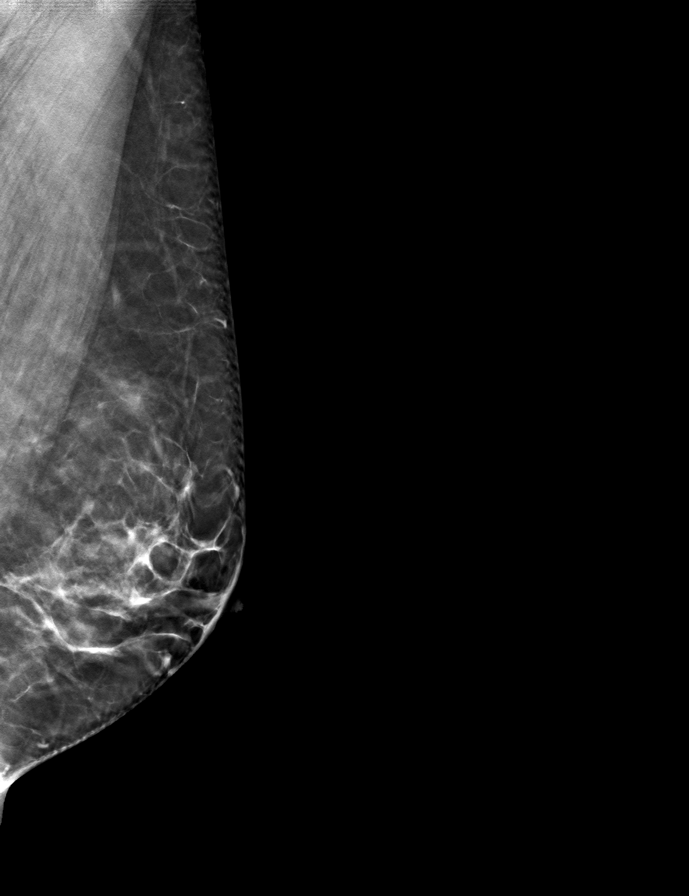

[R MLO tomo · tomo slice 35/68.0]
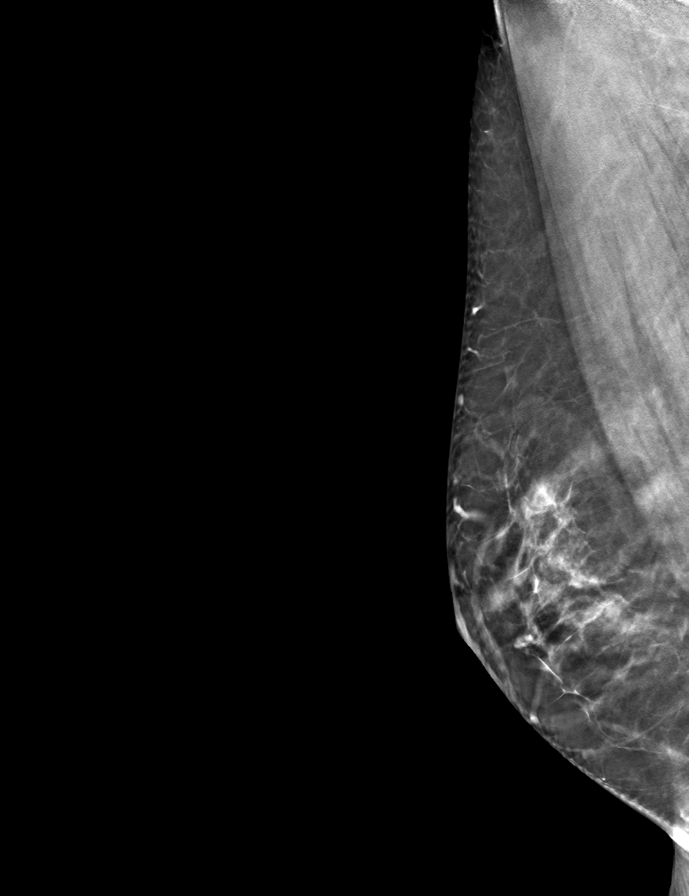

[R CC tomo · tomo slice 33/64.0]
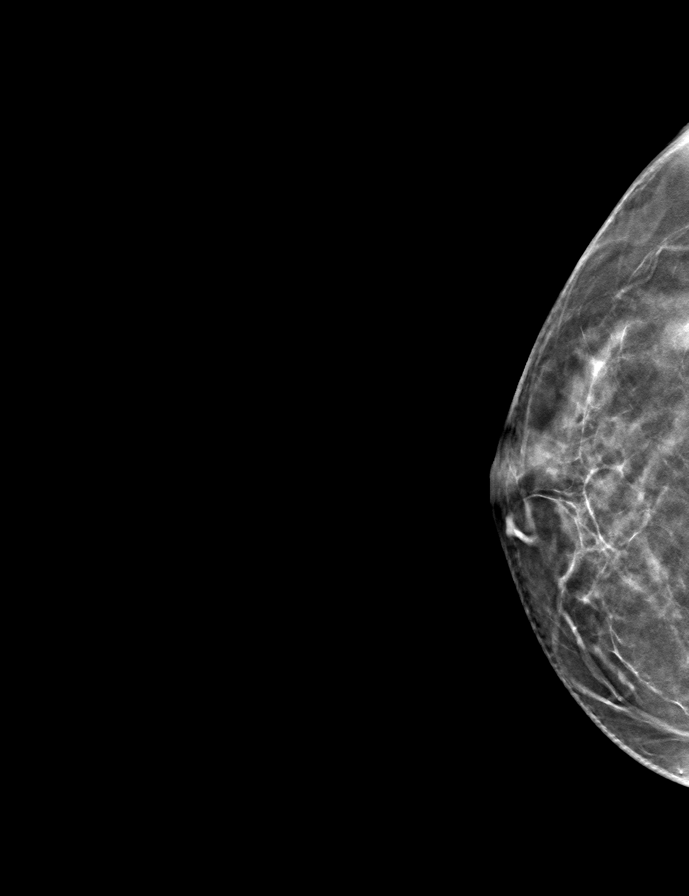

[L CC tomo · tomo slice 34/67.0]
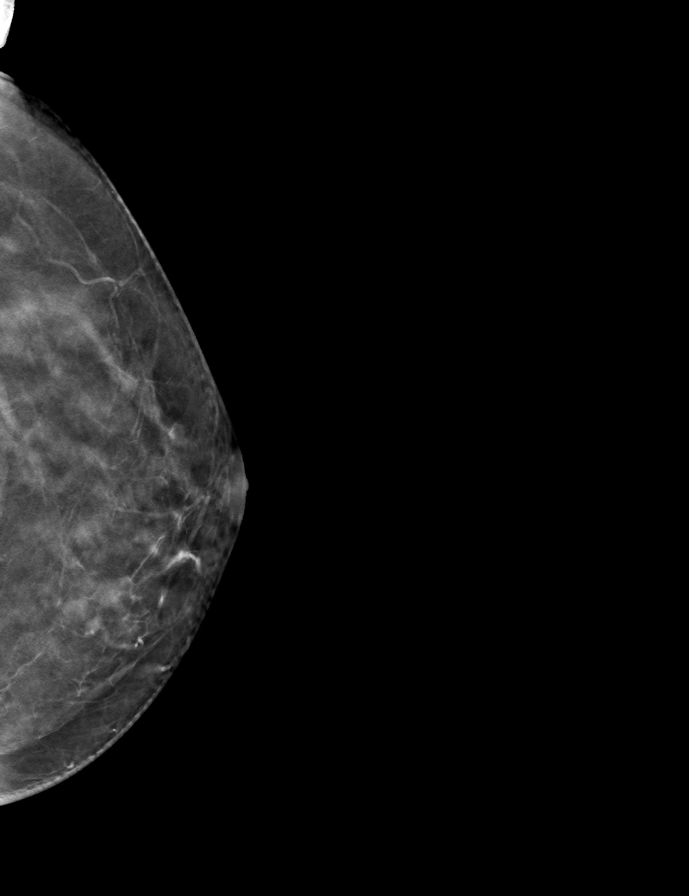

[9 of 24 positions shown; findings below may reference images not displayed]

ACR Breast Density Category b: There are scattered areas of
fibroglandular density.
FINDINGS: There are no findings suspicious for malignancy.
IMPRESSION: No mammographic evidence of malignancy. A result letter of this
screening mammogram will be mailed directly to the patient.

RECOMMENDATION:
Screening mammogram in one year. (Code:51-O-LD2)

BI-RADS CATEGORY  1: Negative.

## 2023-03-27 ENCOUNTER — Other Ambulatory Visit: Payer: Self-pay | Admitting: Family Medicine

## 2023-03-27 DIAGNOSIS — Z1231 Encounter for screening mammogram for malignant neoplasm of breast: Secondary | ICD-10-CM

## 2023-04-04 DIAGNOSIS — R718 Other abnormality of red blood cells: Secondary | ICD-10-CM | POA: Diagnosis not present

## 2023-04-04 DIAGNOSIS — Z1330 Encounter for screening examination for mental health and behavioral disorders, unspecified: Secondary | ICD-10-CM | POA: Diagnosis not present

## 2023-04-04 DIAGNOSIS — M25541 Pain in joints of right hand: Secondary | ICD-10-CM | POA: Diagnosis not present

## 2023-04-04 DIAGNOSIS — M25542 Pain in joints of left hand: Secondary | ICD-10-CM | POA: Diagnosis not present

## 2023-05-14 ENCOUNTER — Ambulatory Visit
Admission: RE | Admit: 2023-05-14 | Discharge: 2023-05-14 | Disposition: A | Discharge status: Home / Self Care | Payer: BC Managed Care – PPO | Source: Ambulatory Visit | Attending: Family Medicine | Admitting: Family Medicine

## 2023-05-14 DIAGNOSIS — Z1231 Encounter for screening mammogram for malignant neoplasm of breast: Secondary | ICD-10-CM

## 2023-06-04 ENCOUNTER — Encounter: Payer: Self-pay | Admitting: Nurse Practitioner

## 2023-06-04 ENCOUNTER — Ambulatory Visit (INDEPENDENT_AMBULATORY_CARE_PROVIDER_SITE_OTHER): Payer: BC Managed Care – PPO | Admitting: Nurse Practitioner

## 2023-06-04 VITALS — BP 118/74 | HR 73 | Ht 62.0 in | Wt 149.0 lb

## 2023-06-04 DIAGNOSIS — Z780 Asymptomatic menopausal state: Secondary | ICD-10-CM

## 2023-06-04 DIAGNOSIS — Z01419 Encounter for gynecological examination (general) (routine) without abnormal findings: Secondary | ICD-10-CM

## 2023-06-04 DIAGNOSIS — N951 Menopausal and female climacteric states: Secondary | ICD-10-CM

## 2023-06-04 NOTE — Progress Notes (Signed)
 Caroline Bridges 25-May-1975 [id number]   History:  48 y.o. G3P3003  presents for annual exam. Postmenopausal - no HRT. Recently sexually active again and has some pain with intercourse. Has not tried OTC lubricants. 2000 cryosurgery, 2009 LEEP, normal paps since. History of hyperthyroidism years ago, mother with hypothyroidism. Smoker.   Gynecologic History No LMP recorded. Patient is postmenopausal.   Contraception/Family planning: tubal ligation Sexually active: Yes  Health Maintenance Last Pap: 03/15/2021. Results were: Normal neg HPV, 3-year repeat Last mammogram: 05/14/2023. Results were: Normal Last colonoscopy: Never Last Dexa: Not indicated  Past medical history, past surgical history, family history and social history were all reviewed and documented in the EPIC chart. 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 86 yo son. 31 yo in [place] in [employer]. 3 granddaughters ages 71m-5y. Mother diagnosed with colon cancer at age 62.   ROS:  A ROS was performed and pertinent positives and negatives are included.  Exam:  Vitals:   06/04/23 0801  BP: 118/74  Pulse: 73  SpO2: 100%  Weight: 149 lb (67.6 kg)  Height: 5\' 2"  (1.575 m)     Body mass index is 27.25 kg/m.  General appearance:  Normal Thyroid:  Symmetrical, normal in size, without palpable masses or nodularity. Respiratory  Auscultation:  Clear without wheezing or rhonchi Cardiovascular  Auscultation:  Regular rate, without rubs, murmurs or gallops  Edema/varicosities:  Not grossly evident Abdominal  Soft,nontender, without masses, guarding or rebound.  Liver/spleen:  No organomegaly noted  Hernia:  None appreciated  Skin  Inspection:  Grossly normal Breasts: Examined lying and sitting.   Right: Without masses, retractions, nipple discharge or axillary adenopathy.   Left: Without masses, retractions, nipple discharge or axillary adenopathy. Pelvic: External genitalia:  no lesions               Urethra:  normal appearing urethra with no masses, tenderness or lesions              Bartholins and Skenes: normal                 Vagina: normal appearing vagina with normal color and discharge, no lesions              Cervix: no lesions Bimanual Exam:  Uterus:  no masses or tenderness              Adnexa: no mass, fullness, tenderness              Rectovaginal: Deferred              Anus:  normal, no lesions  Patient informed chaperone available to be present for breast and pelvic exam. Patient has requested no chaperone to be present. Patient has been advised what will be completed during breast and pelvic exam.   Assessment/Plan:  48 y.o. G3P3003 for annual exam.   Well female exam with routine gynecological exam -  Education provided on SBEs, importance of preventative screenings, current guidelines, high calcium diet, regular exercise, and multivitamin daily. Labs with PCP/through employer.   Postmenopausal - no HRT  Menopausal vaginal dryness - Recently sexually active again and has some pain with intercourse. Has not tried OTC lubricants. [employer]ube samples provided. If no improvement, we briefly discussed vaginal estrogen as an option.   Screening for cervical cancer - 2000 cryosurgery, 2009 LEEP, normal paps since. Will repeat at 3-year interval per guidelines.   Screening for breast cancer - Normal mammogram history.  Continue  annual screenings.  Normal breast exam today.   Screening for colon cancer - Mother diagnosed with colon cancer at age 67. Discussed current guidelines and recommendations. Plans to schedule colonoscopy soon.  Return in about 1 year (around 06/03/2024) for Annual.    Olivia Mackie DNP, 8:25 AM 06/04/2023

## 2023-11-23 DIAGNOSIS — H40033 Anatomical narrow angle, bilateral: Secondary | ICD-10-CM | POA: Diagnosis not present

## 2023-11-23 DIAGNOSIS — H04123 Dry eye syndrome of bilateral lacrimal glands: Secondary | ICD-10-CM | POA: Diagnosis not present
